# Patient Record
Sex: Male | Born: 1935 | Race: Black or African American | Hispanic: No | Marital: Married | Smoking: Never smoker
Health system: Southern US, Community
[De-identification: ages and names within clinical notes are randomized; demographics above are authoritative.]

## PROBLEM LIST (undated history)

## (undated) DIAGNOSIS — N289 Disorder of kidney and ureter, unspecified: Secondary | ICD-10-CM

## (undated) DIAGNOSIS — M1990 Unspecified osteoarthritis, unspecified site: Secondary | ICD-10-CM

## (undated) DIAGNOSIS — E119 Type 2 diabetes mellitus without complications: Secondary | ICD-10-CM

## (undated) HISTORY — PX: LIVER CYST REMOVAL: SHX5951

## (undated) HISTORY — PX: HERNIA REPAIR: SHX51

## (undated) HISTORY — PX: ROTATOR CUFF REPAIR: SHX139

## (undated) HISTORY — DX: Unspecified osteoarthritis, unspecified site: M19.90

---

## 2005-08-23 ENCOUNTER — Ambulatory Visit: Payer: Self-pay | Admitting: Unknown Physician Specialty

## 2007-09-05 ENCOUNTER — Emergency Department: Payer: Self-pay | Admitting: Emergency Medicine

## 2010-02-19 ENCOUNTER — Ambulatory Visit: Payer: Self-pay | Admitting: Ophthalmology

## 2010-03-02 ENCOUNTER — Ambulatory Visit: Payer: Self-pay | Admitting: Ophthalmology

## 2010-03-10 ENCOUNTER — Ambulatory Visit: Payer: Self-pay | Admitting: Cardiology

## 2011-05-19 ENCOUNTER — Ambulatory Visit: Payer: Self-pay | Admitting: Ophthalmology

## 2011-06-07 ENCOUNTER — Ambulatory Visit: Payer: Self-pay | Admitting: Ophthalmology

## 2014-04-19 ENCOUNTER — Emergency Department: Payer: Self-pay | Admitting: Emergency Medicine

## 2014-08-25 NOTE — Op Note (Signed)
PATIENT NAME:  Ross Odonnell, Ross Odonnell DATE OF BIRTH:  Dec 08, 1930  DATE OF PROCEDURE:  06/07/2011  PREOPERATIVE DIAGNOSIS:  Cataract, left eye.    POSTOPERATIVE DIAGNOSIS:  Cataract, left eye.  PROCEDURE PERFORMED:  Extracapsular cataract extraction using phacoemulsification with placement of an Alcon [id number] 21.5-diopter posterior chamber lens, serial # [id number].  SURGEON:  Maylon PeppersSteven A. Camron Monday, MD  ASSISTANT:  None.  ANESTHESIA:  4% lidocaine and 0.75% Marcaine in a 50/50 mixture with 10 units/mL of Hylenex added, given as a peribulbar.  ANESTHESIOLOGIST:  Dr. Darleene CleaverVan Staveren.   COMPLICATIONS:  None.  ESTIMATED BLOOD LOSS:  Less than 1 mL.  DESCRIPTION OF PROCEDURE:  The patient was brought to the operating room and given a peribulbar block.  The patient was then prepped and draped in the usual fashion.  The vertical rectus muscles were imbricated using 5-0 silk sutures.  These sutures were then clamped to the sterile drapes as bridle sutures.  A limbal peritomy was performed extending two clock hours and hemostasis was obtained with cautery.  A partial thickness scleral groove was made at the surgical limbus and dissected anteriorly in a lamellar dissection using an Alcon crescent knife.  The anterior chamber was entered supero-temporally with a Superblade and through the lamellar dissection with a 2.6 mm keratome.  DisCoVisc was used to replace the aqueous and a continuous tear capsulorrhexis was carried out.  Hydrodissection and hydrodelineation were carried out with balanced salt and a 27 gauge canula.  The nucleus was rotated to confirm the effectiveness of the hydrodissection.  Phacoemulsification was carried out using a divide-and-conquer technique.  Total ultrasound time was 1 minute and 25 seconds with an average power of 13.9 percent.  Irrigation/aspiration was used to remove the residual cortex.  DisCoVisc was used to inflate the capsule and the internal incision was  enlarged to 3 mm with the crescent knife.  The intraocular lens was folded and inserted into the capsular bag using the AcrySert delivery system.  Irrigation/aspiration was used to remove the residual DisCoVisc.  Miostat was injected into the anterior chamber through the paracentesis track to inflate the anterior chamber and induce miosis.  The wound was checked for leaks and none were found. The conjunctiva was closed with cautery and the bridle sutures were removed.  Two drops of 0.3% Vigamox were placed on the eye.   An eye shield was placed on the eye.  The patient was discharged to the recovery room in good condition.  ____________________________ Maylon PeppersSteven A. Aanvi Voyles, MD sad:cms D: 06/07/2011 12:30:03 ET T: 06/07/2011 12:49:04 ET JOB#: [id number]  cc: Viviann SpareSteven A. Jahsir Rama, MD, <Dictator> Erline LevineSTEVEN A Faria Casella MD ELECTRONICALLY SIGNED 06/14/2011 13:02

## 2015-07-01 ENCOUNTER — Other Ambulatory Visit: Payer: Self-pay | Admitting: Ophthalmology

## 2015-07-01 DIAGNOSIS — H5462 Unqualified visual loss, left eye, normal vision right eye: Secondary | ICD-10-CM

## 2015-07-04 ENCOUNTER — Ambulatory Visit
Admission: RE | Admit: 2015-07-04 | Discharge: 2015-07-04 | Disposition: A | Discharge status: Home / Self Care | Payer: Medicare Other | Source: Ambulatory Visit | Attending: Ophthalmology | Admitting: Ophthalmology

## 2015-07-04 DIAGNOSIS — R9082 White matter disease, unspecified: Secondary | ICD-10-CM | POA: Diagnosis not present

## 2015-07-04 DIAGNOSIS — M898X8 Other specified disorders of bone, other site: Secondary | ICD-10-CM | POA: Insufficient documentation

## 2015-07-04 DIAGNOSIS — H5452 Low vision, left eye, normal vision right eye: Secondary | ICD-10-CM | POA: Diagnosis not present

## 2015-07-04 DIAGNOSIS — H5462 Unqualified visual loss, left eye, normal vision right eye: Secondary | ICD-10-CM

## 2015-07-04 DIAGNOSIS — J341 Cyst and mucocele of nose and nasal sinus: Secondary | ICD-10-CM | POA: Insufficient documentation

## 2015-07-04 DIAGNOSIS — S0232XA Fracture of orbital floor, left side, initial encounter for closed fracture: Secondary | ICD-10-CM | POA: Diagnosis not present

## 2015-07-04 LAB — POCT I-STAT CREATININE: CREATININE: 2 mg/dL — AB (ref 0.61–1.24)

## 2015-07-04 MED ORDER — GADOBENATE DIMEGLUMINE 529 MG/ML IV SOLN
10.0000 mL | Freq: Once | INTRAVENOUS | Status: AC | PRN
  Administered 2015-07-04: 9 mL via INTRAVENOUS

## 2016-11-25 ENCOUNTER — Emergency Department (HOSPITAL_COMMUNITY)
Admission: EM | Admit: 2016-11-25 | Discharge: 2016-11-25 | Disposition: A | Discharge status: Home / Self Care | Payer: Medicare Other | Attending: Emergency Medicine | Admitting: Emergency Medicine

## 2016-11-25 ENCOUNTER — Encounter (HOSPITAL_COMMUNITY): Payer: Self-pay | Admitting: Emergency Medicine

## 2016-11-25 ENCOUNTER — Emergency Department (HOSPITAL_COMMUNITY): Payer: Medicare Other

## 2016-11-25 DIAGNOSIS — Z23 Encounter for immunization: Secondary | ICD-10-CM | POA: Diagnosis not present

## 2016-11-25 DIAGNOSIS — W270XXA Contact with workbench tool, initial encounter: Secondary | ICD-10-CM | POA: Insufficient documentation

## 2016-11-25 DIAGNOSIS — Y999 Unspecified external cause status: Secondary | ICD-10-CM | POA: Diagnosis not present

## 2016-11-25 DIAGNOSIS — E119 Type 2 diabetes mellitus without complications: Secondary | ICD-10-CM | POA: Diagnosis not present

## 2016-11-25 DIAGNOSIS — S6992XA Unspecified injury of left wrist, hand and finger(s), initial encounter: Secondary | ICD-10-CM | POA: Diagnosis present

## 2016-11-25 DIAGNOSIS — Y929 Unspecified place or not applicable: Secondary | ICD-10-CM | POA: Insufficient documentation

## 2016-11-25 DIAGNOSIS — S62601A Fracture of unspecified phalanx of left index finger, initial encounter for closed fracture: Secondary | ICD-10-CM | POA: Diagnosis not present

## 2016-11-25 DIAGNOSIS — S62600A Fracture of unspecified phalanx of right index finger, initial encounter for closed fracture: Secondary | ICD-10-CM

## 2016-11-25 DIAGNOSIS — Y9389 Activity, other specified: Secondary | ICD-10-CM | POA: Diagnosis not present

## 2016-11-25 HISTORY — DX: Type 2 diabetes mellitus without complications: E11.9

## 2016-11-25 HISTORY — DX: Disorder of kidney and ureter, unspecified: N28.9

## 2016-11-25 MED ORDER — TETANUS-DIPHTH-ACELL PERTUSSIS 5-2.5-18.5 LF-MCG/0.5 IM SUSP
0.5000 mL | Freq: Once | INTRAMUSCULAR | Status: AC
  Administered 2016-11-25: 0.5 mL via INTRAMUSCULAR
  Filled 2016-11-25: qty 0.5

## 2016-11-25 MED ORDER — CEPHALEXIN 500 MG PO CAPS: 500.0000 mg | ORAL_CAPSULE | Freq: Four times a day (QID) | ORAL | 0 refills | Status: AC

## 2016-11-25 MED ORDER — TRAMADOL HCL 50 MG PO TABS: 50.0000 mg | ORAL_TABLET | Freq: Four times a day (QID) | ORAL | 0 refills | Status: AC | PRN

## 2016-11-25 NOTE — ED Triage Notes (Signed)
Pt cut left first, second and third digits on table saw. Bleeding controlled.

## 2016-11-25 NOTE — ED Provider Notes (Signed)
[facility]Y DEPT Provider Note   CSN: [id number] Arrival date & time: 11/25/16  1105     History   Chief Complaint Chief Complaint  Patient presents with  . Hand Pain    HPI Ross Odonnell is a 83 y.o. male.    Patient states that he and his left hand with a saw. This SORT of bounced back against his hand   The history is provided by the patient.  Hand Injury   The incident occurred 3 to 5 hours ago. The incident occurred at home. Injury mechanism: Saw. The pain is present in the left fingers. The quality of the pain is described as aching. The pain is at a severity of 4/10. The pain is moderate. The pain has been constant since the incident. Pertinent negatives include no fever. He reports no foreign bodies present.    Past Medical History:  Diagnosis Date  . Diabetes mellitus without complication (HCC)   . Renal disorder     There are no active problems to display for this patient.   Past Surgical History:  Procedure Laterality Date  . HERNIA REPAIR    . LIVER CYST REMOVAL    . ROTATOR CUFF REPAIR         Home Medications    Prior to Admission medications   Medication Sig Start Date End Date Taking? Authorizing Provider  cephALEXin (KEFLEX) 500 MG capsule Take 1 capsule (500 mg total) by mouth 4 (four) times daily. 11/25/16   Bethann BerkshireZammit, Ira Dougher, MD  traMADol (ULTRAM) 50 MG tablet Take 1 tablet (50 mg total) by mouth every 6 (six) hours as needed. 11/25/16   Bethann BerkshireZammit, Maranatha Grossi, MD    Family History No family history on file.  Social History Social History  Substance Use Topics  . Smoking status: Never Smoker  . Smokeless tobacco: Never Used  . Alcohol use No     Allergies   Claritin [loratadine]   Review of Systems Review of Systems  Constitutional: Negative for appetite change, fatigue and fever.  HENT: Negative for congestion, ear discharge and sinus pressure.   Eyes: Negative for discharge.  Respiratory: Negative for cough.   Cardiovascular:  Negative for chest pain.  Gastrointestinal: Negative for abdominal pain and diarrhea.  Genitourinary: Negative for frequency and hematuria.  Musculoskeletal: Negative for back pain.       Left hand injury  Skin: Negative for rash.  Neurological: Negative for seizures and headaches.  Psychiatric/Behavioral: Negative for hallucinations.     Physical Exam Updated Vital Signs BP (!) 159/47 (BP Location: Right Arm)   Pulse 68   Temp 98.6 F (37 C) (Oral)   Resp 18   Ht 5\' 7"  (1.702 m)   Wt 82.6 kg (182 lb)   SpO2 98%   BMI 28.51 kg/m   Physical Exam  Constitutional: He is oriented to person, place, and time. He appears well-developed.  HENT:  Head: Normocephalic.  Eyes: Conjunctivae and EOM are normal. No scleral icterus.  Neck: Neck supple. No thyromegaly present.  Cardiovascular: Normal rate and regular rhythm.  Exam reveals no gallop and no friction rub.   No murmur heard. Pulmonary/Chest: No stridor. He has no wheezes. He has no rales. He exhibits no tenderness.  Abdominal: He exhibits no distension. There is no tenderness. There is no rebound.  Musculoskeletal: Normal range of motion. He exhibits no edema.  Patient has superficial lacerations on his distal left index finger and distal left middle finger. The lacerations are not suturable  Lymphadenopathy:    He has no cervical adenopathy.  Neurological: He is oriented to person, place, and time. He exhibits normal muscle tone. Coordination normal.  Skin: No rash noted. No erythema.  Psychiatric: He has a normal mood and affect. His behavior is normal.     ED Treatments / Results  Labs (all labs ordered are listed, but only abnormal results are displayed) Labs Reviewed - No data to display  EKG  EKG Interpretation None       Radiology Dg Hand Complete Left  Result Date: 11/25/2016 CLINICAL DATA:  Laceration from table saw to index and middle fingers EXAM: LEFT HAND - COMPLETE 3+ VIEW COMPARISON:  None.  FINDINGS: Radiopaque foreign bodies project over the soft tissues at the tip of the left middle finger. There is a fracture through the distal phalanx of the left index finger, nondisplaced. This appears to enter the DIP joint. No fracture in the middle finger. No subluxation or dislocation. IMPRESSION: Nondisplaced fracture through the distal phalanx of the left index finger. Radiopaque densities at the tip of the left middle finger. Electronically Signed   By: Charlett NoseKevin  Dover M.D.   On: 11/25/2016 11:35    Procedures Procedures (including critical care time)  Medications Ordered in ED Medications  Tdap (BOOSTRIX) injection 0.5 mL (not administered)     Initial Impression / Assessment and Plan / ED Course  I have reviewed the triage vital signs and the nursing notes.  Pertinent labs & imaging results that were available during my care of the patient were reviewed by me and considered in my medical decision making (see chart for details).     Small lacerations and nondisplaced fracture of left index finger. Patient will be placed on pain medicine antibiotics have finger splinted and follow-up with Dr. Romeo AppleHarrison orthopedics  Final Clinical Impressions(s) / ED Diagnoses   Final diagnoses:  Closed nondisplaced fracture of phalanx of right index finger, unspecified phalanx, initial encounter    New Prescriptions New Prescriptions   CEPHALEXIN (KEFLEX) 500 MG CAPSULE    Take 1 capsule (500 mg total) by mouth 4 (four) times daily.   TRAMADOL (ULTRAM) 50 MG TABLET    Take 1 tablet (50 mg total) by mouth every 6 (six) hours as needed.     Bethann BerkshireZammit, Abbie Jablon, MD 11/25/16 1344

## 2016-11-25 NOTE — Discharge Instructions (Signed)
Clean laceration twice a day with soap and water.  Follow up with Dr. Romeo AppleHarrison next week

## 2016-11-30 ENCOUNTER — Ambulatory Visit (INDEPENDENT_AMBULATORY_CARE_PROVIDER_SITE_OTHER): Payer: Medicare Other | Admitting: Orthopaedic Surgery

## 2016-11-30 ENCOUNTER — Encounter: Payer: Self-pay | Admitting: Orthopaedic Surgery

## 2016-11-30 VITALS — BP 133/57 | HR 53 | Temp 97.7°F | Ht 67.0 in | Wt 191.0 lb

## 2016-11-30 DIAGNOSIS — S62661A Nondisplaced fracture of distal phalanx of left index finger, initial encounter for closed fracture: Secondary | ICD-10-CM | POA: Diagnosis not present

## 2016-11-30 NOTE — Progress Notes (Signed)
Subjective:    Patient ID: Ross Odonnell, male    DOB: 12/22/1930, 83 y.o.   MRN: [id number]  HPI He cut his left index and long finger tips on table saw on 11-25-16.  Wounds were superficial but the index finger had fracture of the distal phalanx.  He was seen in the ER.  I have reviewed the ER records, x-rays and x-ray report.  He was given splint.  He is better. His wounds are healing well.  He has no other problem.   Review of Systems  HENT: Negative for congestion.   Respiratory: Negative for cough and shortness of breath.   Cardiovascular: Negative for chest pain and leg swelling.  Endocrine: Negative for cold intolerance.  Musculoskeletal: Positive for arthralgias.  Allergic/Immunologic: Negative for environmental allergies.   Past Medical History:  Diagnosis Date  . Arthritis   . Diabetes mellitus without complication (HCC)   . Renal disorder     Past Surgical History:  Procedure Laterality Date  . HERNIA REPAIR    . LIVER CYST REMOVAL    . ROTATOR CUFF REPAIR      Current Outpatient Prescriptions on File Prior to Visit  Medication Sig Dispense Refill  . cephALEXin (KEFLEX) 500 MG capsule Take 1 capsule (500 mg total) by mouth 4 (four) times daily. 28 capsule 0  . traMADol (ULTRAM) 50 MG tablet Take 1 tablet (50 mg total) by mouth every 6 (six) hours as needed. 15 tablet 0   No current facility-administered medications on file prior to visit.     Social History   Social History  . Marital status: Married    Spouse name: N/A  . Number of children: N/A  . Years of education: N/A   Occupational History  . Not on file.   Social History Main Topics  . Smoking status: Never Smoker  . Smokeless tobacco: Never Used  . Alcohol use No  . Drug use: No  . Sexual activity: Not on file   Other Topics Concern  . Not on file   Social History Narrative  . No narrative on file    History reviewed. No pertinent family history.  BP (!) 133/57   Pulse (!) 53    Temp 97.7 F (36.5 C)   Ht 5\' 7"  (1.702 m)   Wt 191 lb (86.6 kg)   BMI 29.91 kg/m      Objective:   Physical Exam  Constitutional: He is oriented to person, place, and time. He appears well-developed and well-nourished.  HENT:  Head: Normocephalic and atraumatic.  Eyes: Pupils are equal, round, and reactive to light. Conjunctivae and EOM are normal.  Neck: Normal range of motion. Neck supple.  Cardiovascular: Normal rate, regular rhythm and intact distal pulses.   Pulmonary/Chest: Effort normal.  Abdominal: Soft.  Musculoskeletal: He exhibits tenderness (He has superfical healing wounds of the tips of the left index and long fingers, ROM is full, NV intact.  Rest of hand negative.).  Neurological: He is alert and oriented to person, place, and time. He has normal reflexes. He displays normal reflexes. No cranial nerve deficit. He exhibits normal muscle tone. Coordination normal.  Skin: Skin is warm and dry.  Psychiatric: He has a normal mood and affect. His behavior is normal. Judgment and thought content normal.  Vitals reviewed.         Assessment & Plan:   Encounter Diagnosis  Name Primary?  . Closed nondisplaced fracture of distal phalanx of left index finger,  initial encounter Yes   A new finger splint given.  Return in three weeks.  Call if any problem.  Precautions discussed.   Electronically Signed Darreld McleanWayne Mahamed Zalewski, MD 7/31/20182:52 PM

## 2016-12-14 ENCOUNTER — Telehealth: Payer: Self-pay | Admitting: Orthopaedic Surgery

## 2016-12-14 NOTE — Telephone Encounter (Signed)
Patient called to relay that his left index finger is much better, and "not even needing a band-aid" - asking if he may cancel his upcoming follow up appointment f94 12/21/16. Office visit note reads:  "Return in three weeks.   Call if any problem.   Precautions discussed."

## 2016-12-21 ENCOUNTER — Ambulatory Visit (INDEPENDENT_AMBULATORY_CARE_PROVIDER_SITE_OTHER): Payer: Medicare Other

## 2016-12-21 ENCOUNTER — Ambulatory Visit (INDEPENDENT_AMBULATORY_CARE_PROVIDER_SITE_OTHER): Payer: Medicare Other | Admitting: Orthopaedic Surgery

## 2016-12-21 VITALS — BP 120/54 | HR 50 | Temp 97.1°F | Ht 67.0 in | Wt 191.0 lb

## 2016-12-21 DIAGNOSIS — S62661D Nondisplaced fracture of distal phalanx of left index finger, subsequent encounter for fracture with routine healing: Secondary | ICD-10-CM

## 2016-12-21 NOTE — Progress Notes (Signed)
Patient Ross Odonnell, male DOB:[birth year], 83 y.o. BBC:[id number]  Chief Complaint  Patient presents with  . Follow-up    left index finger    HPI  Ross Odonnell is a 83 y.o. male who has a fracture of the left index finger distally.  He is doing well with no problem. HPI  Body mass index is 29.91 kg/m.  ROS  Review of Systems  HENT: Negative for congestion.   Respiratory: Negative for cough and shortness of breath.   Cardiovascular: Negative for chest pain and leg swelling.  Endocrine: Negative for cold intolerance.  Musculoskeletal: Positive for arthralgias.  Allergic/Immunologic: Negative for environmental allergies.    Past Medical History:  Diagnosis Date  . Arthritis   . Diabetes mellitus without complication (HCC)   . Renal disorder     Past Surgical History:  Procedure Laterality Date  . HERNIA REPAIR    . LIVER CYST REMOVAL    . ROTATOR CUFF REPAIR      No family history on file.  Social History Social History  Substance Use Topics  . Smoking status: Never Smoker  . Smokeless tobacco: Never Used  . Alcohol use No    Allergies  Allergen Reactions  . Codeine     Other reaction(s): Other (See Comments) Other Reaction: Urinary retention  . Claritin [Loratadine]     Current Outpatient Prescriptions  Medication Sig Dispense Refill  . cephALEXin (KEFLEX) 500 MG capsule Take 1 capsule (500 mg total) by mouth 4 (four) times daily. 28 capsule 0  . traMADol (ULTRAM) 50 MG tablet Take 1 tablet (50 mg total) by mouth every 6 (six) hours as needed. 15 tablet 0   No current facility-administered medications for this visit.      Physical Exam  Blood pressure (!) 120/54, pulse (!) 50, temperature (!) 97.1 F (36.2 C), height 5\' 7"  (1.702 m), weight 191 lb (86.6 kg).  Constitutional: overall normal hygiene, normal nutrition, well developed, normal grooming, normal body habitus. Assistive device:none  Musculoskeletal: gait and station Limp  none, muscle tone and strength are normal, no tremors or atrophy is present.  .  Neurological: coordination overall normal.  Deep tendon reflex/nerve stretch intact.  Sensation normal.  Cranial nerves II-XII intact.   Skin:   Normal overall no scars, lesions, ulcers or rashes. No psoriasis.  Psychiatric: Alert and oriented x 3.  Recent memory intact, remote memory unclear.  Normal mood and affect. Well groomed.  Good eye contact.  Cardiovascular: overall no swelling, no varicosities, no edema bilaterally, normal temperatures of the legs and arms, no clubbing, cyanosis and good capillary refill.  Lymphatic: palpation is normal.  Left index finger has some distal tenderness.  NV intact. ROM is full.  The patient has been educated about the nature of the problem(s) and counseled on treatment options.  The patient appeared to understand what I have discussed and is in agreement with it.  Encounter Diagnosis  Name Primary?  . Closed nondisplaced fracture of distal phalanx of left index finger with routine healing, subsequent encounter Yes    PLAN Call if any problems.  Precautions discussed.  Continue current medications.   Return to clinic 3 weeks   X-rays on return index finger left.  Electronically Signed Darreld Mclean, MD 8/21/20182:06 PM

## 2017-01-11 ENCOUNTER — Encounter: Payer: Medicare Other | Admitting: Orthopaedic Surgery

## 2017-01-11 ENCOUNTER — Encounter: Payer: Self-pay | Admitting: Orthopaedic Surgery

## 2017-01-12 ENCOUNTER — Other Ambulatory Visit: Payer: Medicare Other

## 2017-01-12 NOTE — Progress Notes (Signed)
This encounter was created in error - please disregard.

## 2017-08-30 IMAGING — DX DG HAND COMPLETE 3+V*L*
3 series · 3 of 3 positions shown · non-contrast
Comparison: None.

CLINICAL DATA: Laceration from table saw to index and middle
fingers

EXAM:
LEFT HAND - COMPLETE 3+ VIEW

[hand pa]
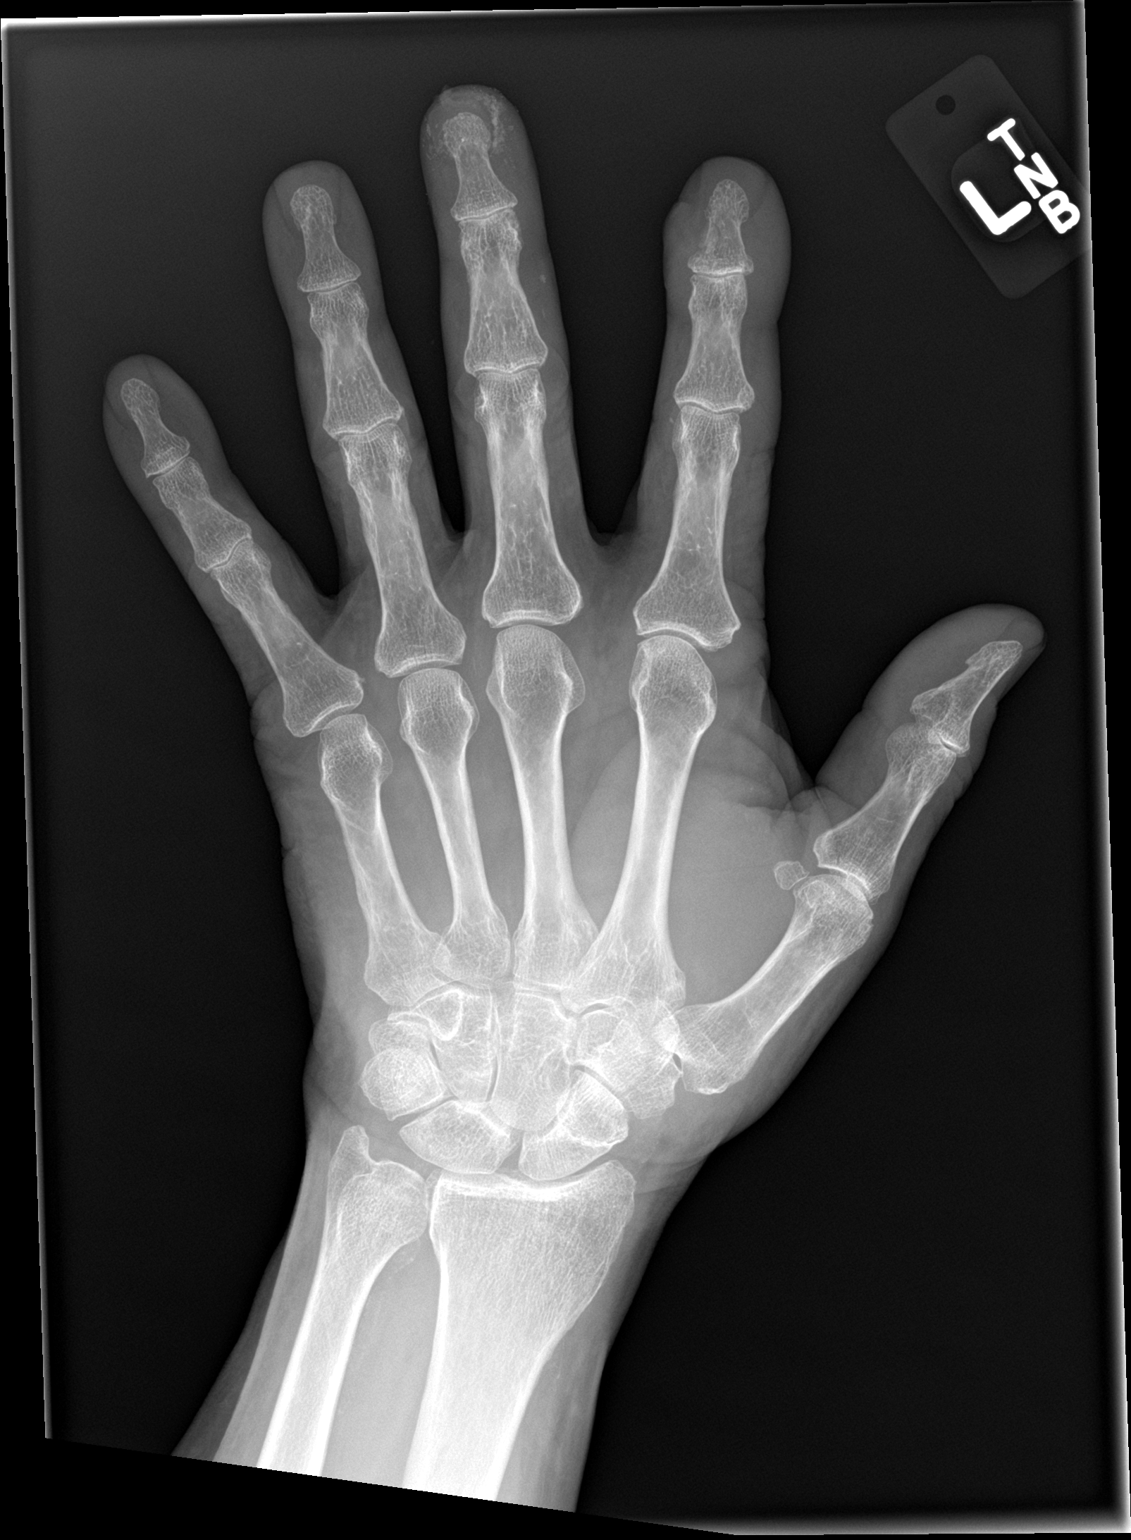

[hand obl]
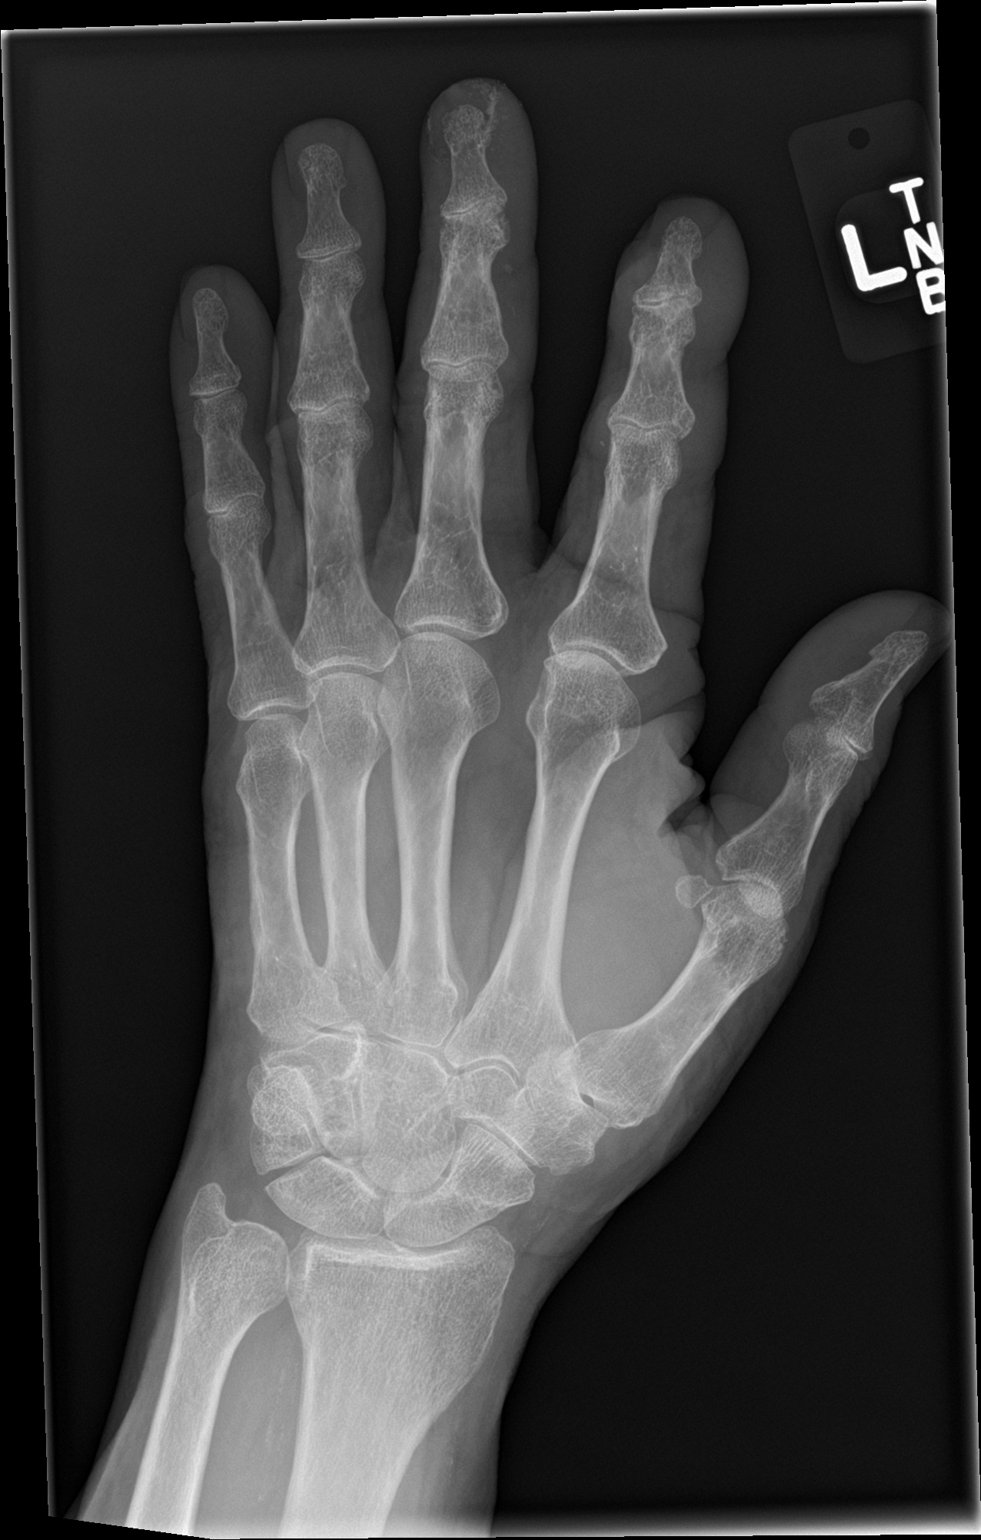

[hand lat]
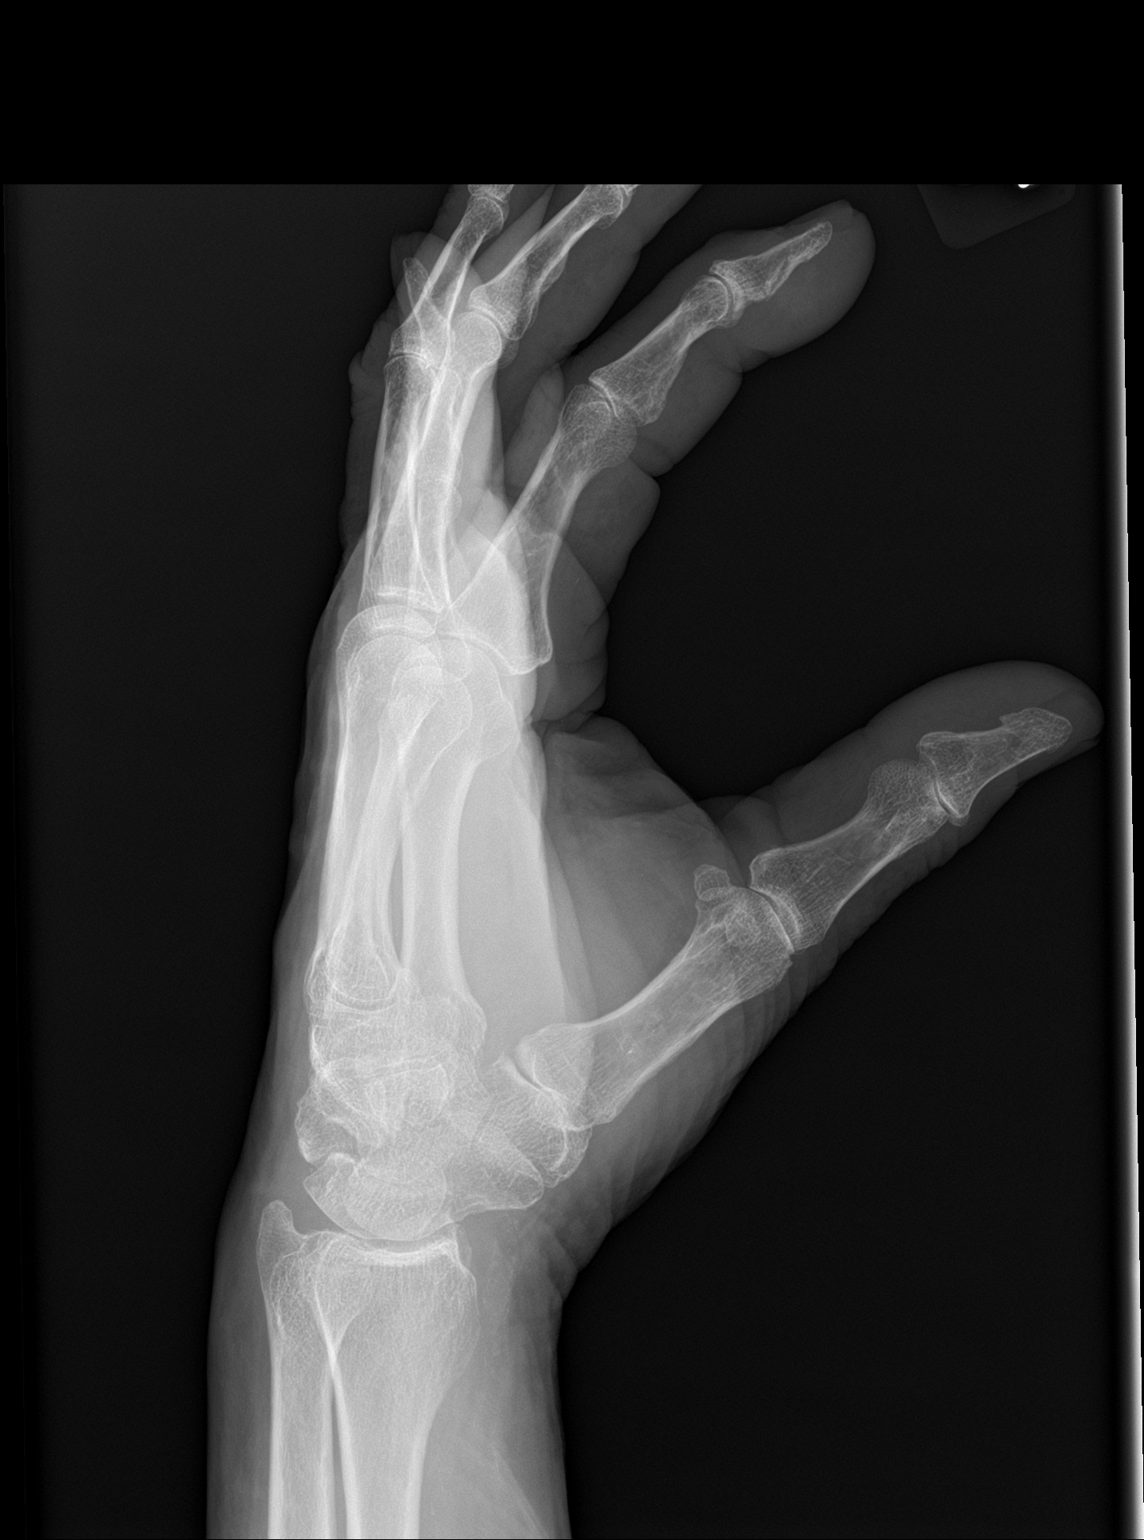

[3 of 3 positions shown; findings below may reference images not displayed]

FINDINGS: Radiopaque foreign bodies project over the soft tissues at the tip
of the left middle finger. There is a fracture through the distal
phalanx of the left index finger, nondisplaced. This appears to
enter the DIP joint. No fracture in the middle finger. No
subluxation or dislocation.
IMPRESSION: Nondisplaced fracture through the distal phalanx of the left index
finger.

Radiopaque densities at the tip of the left middle finger.

## 2017-10-17 ENCOUNTER — Ambulatory Visit: Payer: Medicare Other | Attending: Family Medicine | Admitting: Physical Therapy

## 2017-10-17 ENCOUNTER — Encounter: Payer: Self-pay | Admitting: Physical Therapy

## 2017-10-17 ENCOUNTER — Other Ambulatory Visit: Payer: Self-pay

## 2017-10-17 DIAGNOSIS — R269 Unspecified abnormalities of gait and mobility: Secondary | ICD-10-CM | POA: Insufficient documentation

## 2017-10-17 DIAGNOSIS — M6281 Muscle weakness (generalized): Secondary | ICD-10-CM | POA: Insufficient documentation

## 2017-10-17 NOTE — Therapy (Signed)
[facility] [address] [place], [place], [address] Phone: [phone]   Fax:  [phone]  Physical Therapy Evaluation  Patient Details  Name: Ross Odonnell: [id number] Date of Birth: 02/08/1931 Referring Provider: Dr. Illene RegulusSelvidge   Encounter Date: 10/17/2017  PT End of Session - 10/17/17 1437    Visit Number  1    Number of Visits  4    Date for PT Re-Evaluation  11/14/17    PT Start Time  1341    PT Stop Time  1437    PT Time Calculation (min)  56 min    Activity Tolerance  Patient tolerated treatment well    Behavior During Therapy  [facility] for tasks assessed/performed       Past Medical History:  Diagnosis Date  . Arthritis   . Diabetes mellitus without complication (HCC)   . Renal disorder     Past Surgical History:  Procedure Laterality Date  . HERNIA REPAIR    . LIVER CYST REMOVAL    . ROTATOR CUFF REPAIR      There were no vitals filed for this visit.   Subjective Assessment - 10/17/17 1337    Subjective  Pt. reports no knee pain but moderate B LE (R>L) muscle weakness.  Pt. reports no falls.      Pertinent History  Pt. lives at home with wife.  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  Pt. enjoys gardening.      Limitations  Standing;Walking;House hold activities    Patient Stated Goals  Increase B LE muscle strength.      Currently in Pain?  No/denies         [facility] - 10/17/17 0001      Assessment   Medical Diagnosis  B LE muscle weakness    Referring Provider  Dr. Illene RegulusSelvidge    Onset Date/Surgical Date  10/01/16 sometime in August    Next MD Visit  August (unknown date)    Prior Therapy  yes      Balance Screen   Has the patient fallen in the past 6 months  No      Prior Function   Level of Independence  Independent         See HEP.    PT Education - 10/17/17 1436    Education Details  See HEP    Person(s) Educated  Patient    Methods   Explanation;Demonstration;Handout    Comprehension  Verbalized understanding;Returned demonstration          PT Long Term Goals - 10/17/17 1454      PT LONG TERM GOAL #1   Title  Pt. will be independent with HEP to increase B hip flexion 1 full muscle grade to 4+/5 MMT to improve daily mobility with household/ gardening tasks.      Baseline  Manual muscle testing: B quads 5/5 MMT, B hamstrings 5/5 MMT, B hip adduction/ abduction 5/5 MMT, R hip flexion 3+/5 and L 4-/5 MMT.    Time  4    Period  Weeks    Status  New    Target Date  11/14/17      PT LONG TERM GOAL #2   Title  Pt. will increase FOTO to 62 to improve daily functional mobility.      Baseline  6/17 FOTO baseline 54.      Time  4    Period  Weeks  Status  New    Target Date  11/14/17      PT LONG TERM GOAL #3   Title  Pt. able to complete all aspects of gardening/ yardwork with no c/o B LE muscle weakness/ fatigue.      Baseline  pt. reports increase B LE weakness and fatigue with yardwork.  Primary complaint during PT evaluation.      Time  4    Period  Weeks    Status  New    Target Date  11/14/17          Plan - 10/17/17 1335    Clinical Impression Statement  Pt. is a pleasant 84 y/o male with chronic (>1 year) c/o LE muscle weakness.  Pt. reports no pain in LE/knees at time of evaluation.  Manual muscle testing:  B quads 5/5 MMT, B hamstrings 5/5 MMT, B hip adduction/ abduction 5/5 MMT, R hip flexion 3+/5 and L 4-/5 MMT.  Good B LE muscle sensation.  Slight L hip drop noted during gait pattern.  Pt. ambulates with consistent step pattern/ heel strike with no assistive device.  Supine SLR (-).  R distal hamstring (-27 deg.), proximal 75 deg.  L hamstring distal (-34 deg.), proximal.  FOTO: baseline 54/ goal 62.  Tandem/ SLS (> 20 sec.)- no issues and no falls reported.  Pt. will benefit from short-term skilled PT services to develop home exercise/ strengthening program to improve LE muscle strength and  independence with daily tasks.       Clinical Presentation  Stable    Clinical Presentation due to:  (+) motivation/ active.  (-) age/ chronicity.      Clinical Decision Making  Low    Rehab Potential  Good    PT Frequency  1x / week    PT Duration  4 weeks    PT Treatment/Interventions  ADLs/Self Care Home Management;Cryotherapy;Moist Heat;Gait training;Stair training;Functional mobility training;Therapeutic activities;Therapeutic exercise;Balance training;Patient/family education;Neuromuscular re-education;Manual techniques;Passive range of motion    PT Next Visit Plan  Progress HEP.      PT Home Exercise Plan  See handouts    Consulted and Agree with Plan of Care  Patient       Patient will benefit from skilled therapeutic intervention in order to improve the following deficits and impairments:  Postural dysfunction, Decreased mobility, Decreased activity tolerance, Decreased range of motion, Decreased strength, Hypomobility, Impaired flexibility, Decreased balance, Difficulty walking  Visit Diagnosis: Muscle weakness (generalized)  Gait difficulty     Problem List There are no active problems to display for this patient.  Cammie Mcgee, PT, DPT # [phone] 10/17/2017, 4:36 PM  [facility] [address] [place], [place], [address] Phone: [phone]   Fax:  [phone]  Name: Ross Odonnell: [id number] Date of Birth: 08/30/1930

## 2017-10-17 NOTE — Patient Instructions (Signed)
Access Code: [email or web address]  URL: [email or web address]  Date: 10/17/2017  Prepared by: Dorene GrebeMichael Sherk   Exercises  Sit to Stand without Arm Support - 10 reps - 3 sets - 1x daily - 7x weekly  Dead Bug - 10 reps - 3 sets - 1x daily - 7x weekly  Supine Bridge - 10 reps - 3 sets - 1x daily - 7x weekly  Mini Lunge - 10 reps - 3 sets - 1x daily - 7x weekly

## 2017-10-31 ENCOUNTER — Ambulatory Visit: Payer: Medicare Other | Attending: Family Medicine | Admitting: Physical Therapy

## 2017-10-31 ENCOUNTER — Encounter: Payer: Self-pay | Admitting: Physical Therapy

## 2017-10-31 VITALS — HR 61

## 2017-10-31 DIAGNOSIS — R269 Unspecified abnormalities of gait and mobility: Secondary | ICD-10-CM | POA: Insufficient documentation

## 2017-10-31 DIAGNOSIS — M6281 Muscle weakness (generalized): Secondary | ICD-10-CM | POA: Insufficient documentation

## 2017-10-31 NOTE — Therapy (Signed)
[place] [facility] [address] [place], [place], [address] Phone: [phone]   Fax:  [phone]  Physical Therapy Treatment  Patient Details  Name: Ross Odonnell MRN: [id number] Date of Birth: [birth year] Referring Provider: Dr. Illene RegulusSelvidge   Encounter Date: 10/31/2017  PT End of Session - 10/31/17 0917    Visit Number  2    Number of Visits  4    Date for PT Re-Evaluation  11/14/17    PT Start Time  0811    PT Stop Time  0902    PT Time Calculation (min)  51 min    Activity Tolerance  Patient tolerated treatment well    Behavior During Therapy  [facility] for tasks assessed/performed       Past Medical History:  Diagnosis Date  . Arthritis   . Diabetes mellitus without complication (HCC)   . Renal disorder     Past Surgical History:  Procedure Laterality Date  . HERNIA REPAIR    . LIVER CYST REMOVAL    . ROTATOR CUFF REPAIR      Vitals:   10/31/17 0820  Pulse: 61    Subjective Assessment - 10/31/17 0915    Subjective  Pt reports not having time to do home exercises this week. No pain. Has been busy outdoors managing garden, property.    Pertinent History  Pt. lives at home with wife.  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  Pt. enjoys gardening.      Limitations  Standing;Walking;House hold activities    Patient Stated Goals  Increase B LE muscle strength.      Currently in Pain?  No/denies    Multiple Pain Sites  No          TREATMENT  Warm-up: Nu-step lv 7 x 5 min, lv 4 x 5 min (unbilled)   Supine: BLE HS stretches 3 x 60 sec BLE SLR 2 x 12 - pt demonstrates limited range secondary to HS tightness; cues to slow exercise for better eccentric control and core stabilization at pelvis; core stabilization poor with fatigue  Side-lying: Assessment: tightness in BLE hip flexors, psoas > quad BLE SLR in abd 2 x 8 - pt demonstrates flexion compensation increased with fatigue due to limited range and weakness in hip  abductors; struggles to correct with cues  Supine: Bridges 3 x 10 with cues to "push" hips and keep low back straight for increased hip extension        PT Education - 10/31/17 0916    Education Details  HEP - Hip add/abd therex, thomas stretch    Person(s) Educated  Patient    Methods  Explanation;Demonstration;Handout    Comprehension  Verbalized understanding;Verbal cues required;Returned demonstration          PT Long Term Goals - 10/17/17 1454      PT LONG TERM GOAL #1   Title  Pt. will be independent with HEP to increase B hip flexion 1 full muscle grade to 4+/5 MMT to improve daily mobility with household/ gardening tasks.      Baseline  Manual muscle testing: B quads 5/5 MMT, B hamstrings 5/5 MMT, B hip adduction/ abduction 5/5 MMT, R hip flexion 3+/5 and L 4-/5 MMT.    Time  4    Period  Weeks    Status  New    Target Date  11/14/17      PT LONG TERM GOAL #2   Title  Pt.  will increase FOTO to 62 to improve daily functional mobility.      Baseline  6/17 FOTO baseline 54.      Time  4    Period  Weeks    Status  New    Target Date  11/14/17      PT LONG TERM GOAL #3   Title  Pt. able to complete all aspects of gardening/ yardwork with no c/o B LE muscle weakness/ fatigue.      Baseline  pt. reports increase B LE weakness and fatigue with yardwork.  Primary complaint during PT evaluation.      Time  4    Period  Weeks    Status  New    Target Date  11/14/17         Plan - 10/31/17 0917    Clinical Impression Statement  Pt demonstrates mild forward flexed posture secondary to tightness in B hip flexors; limited but able to reach 0 deg. hip ext. Pt able to tolerate thomas stretch, HS stretch. Difficulty with low squat secondary to weakness and limited ROM but able to perform mini squats and STS from low chair. Demonstrates external rotation tendency L>R in sidestepping; pt able to correct with cues. Pt will benefit from short-term skilled PT services to develop  HEP and improve LE strength, squat mechanics, and independence with daily tasks.    Rehab Potential  Good    PT Frequency  1x / week    PT Duration  4 weeks    PT Treatment/Interventions  ADLs/Self Care Home Management;Cryotherapy;Moist Heat;Gait training;Stair training;Functional mobility training;Therapeutic activities;Therapeutic exercise;Balance training;Patient/family education;Neuromuscular re-education;Manual techniques;Passive range of motion    PT Next Visit Plan  Progress HEP. Hip abd/add to improve stabilization during low squat    PT Home Exercise Plan  See handouts    Consulted and Agree with Plan of Care  Patient       Patient will benefit from skilled therapeutic intervention in order to improve the following deficits and impairments:  Postural dysfunction, Decreased mobility, Decreased activity tolerance, Decreased range of motion, Decreased strength, Hypomobility, Impaired flexibility, Decreased balance, Difficulty walking  Visit Diagnosis: Muscle weakness (generalized)  Gait difficulty     Problem List There are no active problems to display for this patient.  Cammie Mcgee, PT, DPT # [id number] Janee Morn, SPT 10/31/2017, 1:25 PM  [place] [facility] [address] [place], [place], [address] Phone: [phone]   Fax:  [phone]  Name: Ross Odonnell MRN: [id number] Date of Birth: 1931/02/28

## 2017-11-14 ENCOUNTER — Ambulatory Visit: Payer: Medicare Other | Admitting: Physical Therapy

## 2017-11-17 ENCOUNTER — Ambulatory Visit: Payer: Medicare Other | Admitting: Physical Therapy

## 2017-11-17 ENCOUNTER — Encounter: Payer: Self-pay | Admitting: Physical Therapy

## 2017-11-17 DIAGNOSIS — M6281 Muscle weakness (generalized): Secondary | ICD-10-CM

## 2017-11-17 DIAGNOSIS — R269 Unspecified abnormalities of gait and mobility: Secondary | ICD-10-CM

## 2017-11-17 NOTE — Therapy (Signed)
[place] [facility] [address] [place], [place], [address] Phone: [phone]   Fax:  [phone]  Physical Therapy Treatment  Patient Details  Name: Ross Odonnell MRN: [id number] Date of Birth: 20-Feb-1931 Referring Provider: Dr. Illene Regulus   Encounter Date: 11/17/2017  PT End of Session - 11/17/17 0939    Visit Number  3    Number of Visits  7    Date for PT Re-Evaluation  12/15/17    PT Start Time  0809    PT Stop Time  0912    PT Time Calculation (min)  63 min    Activity Tolerance  Patient tolerated treatment well    Behavior During Therapy  [facility] for tasks assessed/performed       Past Medical History:  Diagnosis Date  . Arthritis   . Diabetes mellitus without complication (HCC)   . Renal disorder     Past Surgical History:  Procedure Laterality Date  . HERNIA REPAIR    . LIVER CYST REMOVAL    . ROTATOR CUFF REPAIR      There were no vitals filed for this visit.  Subjective Assessment - 11/17/17 0818    Subjective  Pt. reports no new symptoms this morning and reports no pain in knee.  Pt. has been performing some of the stretches from HEP, and staying active in the garden.    Pertinent History  Pt. lives at home with wife.  Pt. is retired and used to operate a Runner, broadcasting/film/video.  Pt. enjoys gardening.      Limitations  Standing;Walking;House hold activities    Patient Stated Goals  Increase B LE muscle strength.      Currently in Pain?  No/denies    Multiple Pain Sites  No       Treatment:   Warm-up: Scifit Level 5.5 forward/ 5 min backward  There Ex: Supine B LE SLR 2x10 Supine Bridges 2x15 Seated B LE Marches with 4# ankle weight 2x30 Seated B LE LAQ with 4# ankle weight 2x15 Supine Bicycle Kicks 2x10 Ascend/ Descend Stair with no UE support x8  Manual: Supine: B LE HS stretches 3 x 60 sec (distal and proximal) each B LE Thomas Stretch with Rectus Stretch 3x30sec B LE Piriformis 2x30 sec B LE Figure-4  2x30 sec   See new HEP     PT Education - 11/17/17 1050    Education Details  See new HEP/ pt. planning using pulley assist with hamstring stretches    Person(s) Educated  Patient    Methods  Explanation;Demonstration;Handout    Comprehension  Verbalized understanding;Returned demonstration          PT Long Term Goals - 11/17/17 0943      PT LONG TERM GOAL #1   Title  Pt. will be independent with HEP to increase B hip flexion 1 full muscle grade to 4+/5 MMT to improve daily mobility with household/ gardening tasks.    (Pended)     Baseline  Manual muscle testing: B quads 5/5 MMT, B hamstrings 5/5 MMT, B hip adduction/ abduction 5/5 MMT, R hip flexion 3+/5 and L 4-/5 MMT.  (Pended)     Time  4  (Pended)     Period  Weeks  (Pended)     Status  On-going  (Pended)     Target Date  11/14/17  (Pended)       PT LONG TERM GOAL #2   Title  Pt. will increase FOTO to  62 to improve daily functional mobility.    (Pended)     Baseline  6/17 FOTO baseline 54.    (Pended)     Time  4  (Pended)     Period  Weeks  (Pended)     Status  On-going  (Pended)     Target Date  11/14/17  (Pended)       PT LONG TERM GOAL #3   Title  Pt. able to complete all aspects of gardening/ yardwork with no c/o B LE muscle weakness/ fatigue.    (Pended)     Baseline  Pt. able to work in garden and on tractor without complaint of fatigue.  (Pended)     Time  4  (Pended)     Period  Weeks  (Pended)     Status  Achieved  (Pended)     Target Date  11/14/17  (Pended)       PT LONG TERM GOAL #4   Title  Pt. will perform star excursion test with no LOB.  (Pended)     Baseline  Pt. currently displays multiple (4+) steps due to LOB during star excursion test.  (Pended)     Time  4  (Pended)     Period  Weeks  (Pended)     Status  New  (Pended)     Target Date  12/15/17  (Pended)             Plan - 11/17/17 [id number]    Clinical Impression Statement  Pt. performing well in threapy with exercises and is  demonstrating increased strength in hip musculature without pain in L LE.  Pt. still exhibitis decreased ROM with B LE hip extension, which will be address in future viits.  Pt. demonstrates decreased balance during star excursion during session.  Good recip. step pattern with ascending/descending 4 steps in gym with no LOB.  Pt. will continue to benefit from skilled therapy for balance deficits and decreased ROM in LE.    Clinical Presentation  Stable    Clinical Decision Making  Low    Rehab Potential  Good    PT Frequency  Biweekly    PT Duration  4 weeks    PT Treatment/Interventions  ADLs/Self Care Home Management;Cryotherapy;Moist Heat;Gait training;Stair training;Functional mobility training;Therapeutic activities;Therapeutic exercise;Balance training;Patient/family education;Neuromuscular re-education;Manual techniques;Passive range of motion    PT Next Visit Plan  Assess balance and add in balance related program to HEP.    PT Home Exercise Plan  See handouts    Consulted and Agree with Plan of Care  Patient       Patient will benefit from skilled therapeutic intervention in order to improve the following deficits and impairments:  Postural dysfunction, Decreased mobility, Decreased activity tolerance, Decreased range of motion, Decreased strength, Hypomobility, Impaired flexibility, Decreased balance, Difficulty walking  Visit Diagnosis: Muscle weakness (generalized)  Gait difficulty     Problem List There are no active problems to display for this patient.  Cammie McgeeMichael C Sherk, PT, DPT # [id number] Tomasa HoseJosh Elton Heid, SPT 11/17/2017, 10:54 AM  [place] [facility] [address] [place], [place], [address] Phone: [phone]   Fax:  [phone]  Name: Ross Odonnell MRN: [id number] Date of Birth: [birth year]

## 2017-11-17 NOTE — Patient Instructions (Signed)
Access Code: [email or web address]  URL: [email or web address]  Date: 11/17/2017  Prepared by: Dorene GrebeMichael Kentavious Michele   Exercises  Standing Hip Extension - 20 reps - 1 sets - 2 hold - 1x daily - 7x weekly  Supine Hamstring Stretch with Strap - 3 reps - 1 sets - 20 hold - 1x daily - 7x weekly  Supine Bicycles - 20 reps - 1 sets - 1x daily - 7x weekly

## 2017-11-28 ENCOUNTER — Ambulatory Visit: Payer: Medicare Other | Admitting: Physical Therapy

## 2017-11-28 ENCOUNTER — Ambulatory Visit: Payer: Medicare Other

## 2017-11-28 DIAGNOSIS — M6281 Muscle weakness (generalized): Secondary | ICD-10-CM

## 2017-11-28 DIAGNOSIS — R269 Unspecified abnormalities of gait and mobility: Secondary | ICD-10-CM

## 2017-11-28 NOTE — Therapy (Signed)
[place] [facility] [address] [place], [place], [address] Phone: [phone]   Fax:  [phone]  Physical Therapy Treatment  Patient Details  Name: Ross Odonnell MRN: [id number] Date of Birth: 25-Apr-1931 Referring Provider: Dr. Illene Regulus   Encounter Date: 11/28/2017  PT End of Session - 11/28/17 1722    Visit Number  4    Number of Visits  7    Date for PT Re-Evaluation  12/15/17    PT Start Time  1303    PT Stop Time  1402    PT Time Calculation (min)  59 min    Activity Tolerance  Patient tolerated treatment well    Behavior During Therapy  [facility] for tasks assessed/performed       Past Medical History:  Diagnosis Date  . Arthritis   . Diabetes mellitus without complication (HCC)   . Renal disorder     Past Surgical History:  Procedure Laterality Date  . HERNIA REPAIR    . LIVER CYST REMOVAL    . ROTATOR CUFF REPAIR      There were no vitals filed for this visit.  Subjective Assessment - 11/28/17 1721    Subjective  Pt. reports no new complaints upon arrival to clinic.  Pt. reports to have been compliant with HEP since last visit.    Pertinent History  Pt. lives at home with wife.  Pt. is retired and used to operate a Runner, broadcasting/film/video.  Pt. enjoys gardening.      Limitations  Standing;Walking;House hold activities    Patient Stated Goals  Increase B LE muscle strength.      Currently in Pain?  No/denies          Treatment:  Warm-up: Scifit Level 4 x 15 min (unbilled);  Ther-ex  Total Gym (TG) squats x 16, x 18; TG heel raises 2 x 15; Standing hip marches, abduction, extension with 4# ankle weights 2 x 20 bilateral; Stair training ascending/descending 4 x 5 (20 total); Hip hikes on 6" step x 10 bilaterally; Black tband resisted walking forward/backwards/laterally x 10 each direction;                       PT Education - 11/29/17 1134    Education Details  exercise form/technique    Person(s)  Educated  Patient    Methods  Explanation    Comprehension  Verbalized understanding          PT Long Term Goals - 11/17/17 0943      PT LONG TERM GOAL #1   Title  Pt. will be independent with HEP to increase B hip flexion 1 full muscle grade to 4+/5 MMT to improve daily mobility with household/ gardening tasks.    (Pended)     Baseline  Manual muscle testing: B quads 5/5 MMT, B hamstrings 5/5 MMT, B hip adduction/ abduction 5/5 MMT, R hip flexion 3+/5 and L 4-/5 MMT.  (Pended)     Time  4  (Pended)     Period  Weeks  (Pended)     Status  On-going  (Pended)     Target Date  11/14/17  (Pended)       PT LONG TERM GOAL #2   Title  Pt. will increase FOTO to 62 to improve daily functional mobility.    (Pended)     Baseline  6/17 FOTO baseline 54.    (Pended)     Time  4  (  Pended)     Period  Weeks  (Pended)     Status  On-going  (Pended)     Target Date  11/14/17  (Pended)       PT LONG TERM GOAL #3   Title  Pt. able to complete all aspects of gardening/ yardwork with no c/o B LE muscle weakness/ fatigue.    (Pended)     Baseline  Pt. able to work in garden and on tractor without complaint of fatigue.  (Pended)     Time  4  (Pended)     Period  Weeks  (Pended)     Status  Achieved  (Pended)     Target Date  11/14/17  (Pended)       PT LONG TERM GOAL #4   Title  Pt. will perform star excursion test with no LOB.  (Pended)     Baseline  Pt. currently displays multiple (4+) steps due to LOB during star excursion test.  (Pended)     Time  4  (Pended)     Period  Weeks  (Pended)     Status  New  (Pended)     Target Date  12/15/17  (Pended)             Plan - 11/28/17 1723    Clinical Impression Statement  Pt. continues to improve in strength of B LE and performs well with increasing difficulty of exercises.  Pt. responded well to resisted walking and increased reps of stair climbing.  Pt. will continue to benefit from skilled therapy to increase strength in B LE and to  decrease risk for falls.    Clinical Presentation  Stable    Clinical Decision Making  Low    Rehab Potential  Good    PT Frequency  Biweekly    PT Duration  4 weeks    PT Treatment/Interventions  ADLs/Self Care Home Management;Cryotherapy;Moist Heat;Gait training;Stair training;Functional mobility training;Therapeutic activities;Therapeutic exercise;Balance training;Patient/family education;Neuromuscular re-education;Manual techniques;Passive range of motion    PT Next Visit Plan  Assess balance and add in balance related program to HEP.    PT Home Exercise Plan  See handouts    Consulted and Agree with Plan of Care  Patient       Patient will benefit from skilled therapeutic intervention in order to improve the following deficits and impairments:  Postural dysfunction, Decreased mobility, Decreased activity tolerance, Decreased range of motion, Decreased strength, Hypomobility, Impaired flexibility, Decreased balance, Difficulty walking  Visit Diagnosis: Muscle weakness (generalized)  Gait difficulty     Problem List There are no active problems to display for this patient.  This entire session was performed under direct supervision and direction of a licensed therapist/therapist assistant . I have personally read, edited and approve of the note as written.   Nolon BussingJoshua Robbins SPT Lynnea MaizesJason D Arionne Iams PT, DPT, GCS  Dereonna Lensing 11/29/2017, 12:03 PM  [place] [facility] [address] [place], [place], [address] Phone: [phone]   Fax:  [phone]  Name: Ross Odonnell MRN: [id number] Date of Birth: 09-16-1930

## 2017-12-12 ENCOUNTER — Ambulatory Visit: Payer: Medicare Other | Attending: Family Medicine | Admitting: Physical Therapy

## 2017-12-12 ENCOUNTER — Encounter: Payer: Self-pay | Admitting: Physical Therapy

## 2017-12-12 DIAGNOSIS — R269 Unspecified abnormalities of gait and mobility: Secondary | ICD-10-CM

## 2017-12-12 DIAGNOSIS — M6281 Muscle weakness (generalized): Secondary | ICD-10-CM | POA: Diagnosis present

## 2017-12-12 NOTE — Therapy (Signed)
[place] [facility] [address] [place], [place], [address] Phone: [phone]   Fax:  [phone]  Physical Therapy Treatment  Patient Details  Name: Ross Odonnell MRN: [id number] Date of Birth: 1930/05/24 Referring Provider: Dr. Shade Flood   Encounter Date: 12/12/2017  PT End of Session - 12/12/17 0804    Visit Number  5    Number of Visits  7    Date for PT Re-Evaluation  12/15/17    PT Start Time  0804    PT Stop Time  0856    PT Time Calculation (min)  52 min    Activity Tolerance  Patient tolerated treatment well    Behavior During Therapy  [facility] for tasks assessed/performed       Past Medical History:  Diagnosis Date  . Arthritis   . Diabetes mellitus without complication ([place])   . Renal disorder     Past Surgical History:  Procedure Laterality Date  . HERNIA REPAIR    . LIVER CYST REMOVAL    . ROTATOR CUFF REPAIR      There were no vitals filed for this visit.  Subjective Assessment - 12/12/17 0803    Subjective  Pt. states he is doing well.  No new complaints.  Pt. states he is working hard in his garden.      Pertinent History  Pt. lives at home with wife.  Pt. is retired and used to operate a Furniture conservator/restorer.  Pt. enjoys gardening.      Limitations  Standing;Walking;House hold activities    Patient Stated Goals  Increase B LE muscle strength.      Currently in Pain?  No/denies         Treatment:  Warm-up: Scifit Level 5 x 10+ min (unbilled);  Ther-ex  Total Gym (TG) squats x 20 Standing hip marches, abduction, extension with 4# ankle weights 2 x 20 bilateral; Stair training ascending/descending 4x with recip. Pattern. Hip hikes on 6" step x 10 bilaterally; Reviewed HEP in depth.   Supine LE stretches (7 min.).       PT Long Term Goals - 12/12/17 0847      PT LONG TERM GOAL #1   Title  Pt. will be independent with HEP to increase B hip flexion 1 full muscle grade to 4+/5 MMT to improve daily mobility  with household/ gardening tasks.      Baseline  Manual muscle testing: B quads 5/5 MMT, B hamstrings 5/5 MMT, B hip adduction/ abduction 5/5 MMT, R hip flexion 4+/5 and L 4+/5 MMT.    Time  4    Period  Weeks    Status  Achieved    Target Date  12/12/17      PT LONG TERM GOAL #2   Title  Pt. will increase FOTO to 62 to improve daily functional mobility.      Baseline  6/17 FOTO baseline 54.  8/12: 66 (goal met).      Time  4    Period  Weeks    Status  Achieved    Target Date  12/12/17      PT LONG TERM GOAL #3   Title  Pt. able to complete all aspects of gardening/ yardwork with no c/o B LE muscle weakness/ fatigue.      Baseline  no issues reported.      Time  4    Period  Weeks    Status  Achieved    Target Date  12/12/17         Plan - 12/12/17 1003    Clinical Impression Statement  Pt. has progressed well towards all PT goals.  Pt. understands importance of staying active and HEP.  Pt. instructed to contact PT if any c/o knee pain/ worsening symptoms.  Discharge from PT at this time.      Clinical Presentation  Stable    Clinical Decision Making  Low    Rehab Potential  Good    PT Frequency  Biweekly    PT Duration  4 weeks    PT Treatment/Interventions  ADLs/Self Care Home Management;Cryotherapy;Moist Heat;Gait training;Stair training;Functional mobility training;Therapeutic activities;Therapeutic exercise;Balance training;Patient/family education;Neuromuscular re-education;Manual techniques;Passive range of motion    PT Next Visit Plan  Discharge    PT Home Exercise Plan  See handouts    Consulted and Agree with Plan of Care  Patient       Patient will benefit from skilled therapeutic intervention in order to improve the following deficits and impairments:  Postural dysfunction, Decreased mobility, Decreased activity tolerance, Decreased range of motion, Decreased strength, Hypomobility, Impaired flexibility, Decreased balance, Difficulty walking  Visit  Diagnosis: Muscle weakness (generalized)  Gait difficulty     Problem List There are no active problems to display for this patient.  Pura Spice, PT, DPT # [id number] 12/13/2017, 7:57 AM  [place] [facility] [address] [place], [place], [address] Phone: [phone]   Fax:  [phone]  Name: Ross Odonnell MRN: [id number] Date of Birth: Apr 17, 1931

## 2019-09-27 ENCOUNTER — Ambulatory Visit: Payer: Medicare Other | Admitting: Physical Therapy

## 2019-10-04 ENCOUNTER — Other Ambulatory Visit: Payer: Self-pay

## 2019-10-04 ENCOUNTER — Encounter: Payer: Self-pay | Admitting: Physical Therapy

## 2019-10-04 ENCOUNTER — Ambulatory Visit: Payer: Medicare Other | Attending: Family Medicine | Admitting: Physical Therapy

## 2019-10-04 DIAGNOSIS — R269 Unspecified abnormalities of gait and mobility: Secondary | ICD-10-CM | POA: Diagnosis present

## 2019-10-04 DIAGNOSIS — M6281 Muscle weakness (generalized): Secondary | ICD-10-CM

## 2019-10-05 NOTE — Therapy (Addendum)
[place] [facility] [address]. [place], [place], [address] Phone: [phone]   Fax:  [phone]  Physical Therapy Evaluation  Patient Details  Name: Ross Odonnell MRN: [id number] Date of Birth: Oct 16, 1930 Referring Provider (PT): Dr. Danie Chandler   Encounter Date: 10/04/2019    PT End of Session - 10/05/19 1303    Visit Number  1    Number of Visits  9    Date for PT Re-Evaluation  11/01/19    Authorization - Visit Number  1    Authorization - Number of Visits  10    PT Start Time  0811    PT Stop Time  0901    PT Time Calculation (min)  50 min    Activity Tolerance  Patient tolerated treatment well    Behavior During Therapy  [facility] for tasks assessed/performed       Past Medical History:  Diagnosis Date  . Arthritis   . Diabetes mellitus without complication (HCC)   . Renal disorder     Past Surgical History:  Procedure Laterality Date  . HERNIA REPAIR    . LIVER CYST REMOVAL    . ROTATOR CUFF REPAIR      There were no vitals filed for this visit.   Subjective Assessment - 10/04/19 0831    Subjective  Pt. reports no knee pain but weakness, esp. with stairs/ steps on curb.    Pertinent History  Pt. lives at home with wife.  Pt. is retired and used to operate a Runner, broadcasting/film/video.  Pt. enjoys gardening.      Limitations  Standing;Walking;House hold activities    Patient Stated Goals  Increase B LE muscle strength.      Currently in Pain?  No/denies         [facility] PT Assessment - 10/05/19 0001      Assessment   Medical Diagnosis  B LE muscle weakness    Referring Provider (PT)  Dr. Danie Chandler    Onset Date/Surgical Date  05/04/19          See flowsheet    Objective measurements completed on examination: See above findings.     Supine SLR/ bridging 10x each.   Nustep L4 10 min. B UE/LE Discussed current ex. Program at home  PT will issue HEP next tx. session    PT Long Term Goals - 10/07/19 1758      PT LONG TERM  GOAL #1   Title  Pt. will be independent with HEP to increase B hip/knee MMT 1/2 muscle grade to improve daily mobility with household/ gardening tasks.    Baseline  Manual muscle testing: B quads 4+/5 MMT (R knee crepitus noted), B hamstrings 5/5 MMT, B hip adduction/ abduction 4+/5 MMT, B hip flexion 4-/5 MMT.    Time  4    Period  Weeks    Status  New    Target Date  11/01/19      PT LONG TERM GOAL #2   Title  Pt. will increase FOTO to 59 to improve daily functional mobility.    Baseline  40    Time  4    Period  Weeks    Status  New    Target Date  11/01/19      PT LONG TERM GOAL #3   Title  Pt. able to complete all aspects of gardening/ yardwork with no c/o B LE muscle weakness/ fatigue.      Baseline  c/o  difficulty with R>L LE with walking.  Pt. states legs give out/ fatigued.    Time  4    Period  Weeks    Status  New    Target Date  11/01/19             Plan - 10/07/19 1751    Clinical Impression Statement  Pt. is a pleasant 86 y/o male with chronic h/o LE muscle weakness.  Pt. has been to PT in past (2 years ago) for development of strengthening ex. program.  Pt. reports no pain in LE/knees at time of evaluation.  Manual muscle testing: B quads 4+/5 MMT (R knee crepitus noted), B hamstrings 5/5 MMT, B hip adduction/ abduction 4+/5 MMT, B hip flexion 4-/5 MMT.  Good B LE muscle sensation.  Pt. ambulates with consistent step pattern/ heel strike with no assistive device.  PT discussed using SPC on L to offset R knee instability during recip. gait pattern.  Supine SLR 10x (no symptoms).  R knee AROM: -5 to 124 deg.  L knee AROM: -2 to 125 deg.  FOTO: baseline 40/ goal 59.  Pt. able to STS with no UE assist and no c/o knee pain.  Pt. will benefit from short-term skilled PT services to develop home exercise/ strengthening program to improve LE muscle strength and independence with daily tasks.    Stability/Clinical Decision Making  Evolving/Moderate complexity    Clinical  Decision Making  Moderate    Rehab Potential  Good    PT Frequency  2x / week    PT Duration  4 weeks    PT Treatment/Interventions  ADLs/Self Care Home Management;Cryotherapy;Moist Heat;Gait training;Stair training;Functional mobility training;Therapeutic activities;Therapeutic exercise;Balance training;Patient/family education;Neuromuscular re-education;Manual techniques;Passive range of motion    PT Next Visit Plan  Issue HEP    Consulted and Agree with Plan of Care  Patient       Patient will benefit from skilled therapeutic intervention in order to improve the following deficits and impairments:  Postural dysfunction, Decreased mobility, Decreased activity tolerance, Decreased range of motion, Decreased strength, Hypomobility, Impaired flexibility, Decreased balance, Difficulty walking  Visit Diagnosis: Muscle weakness (generalized)  Gait difficulty     Problem List There are no problems to display for this patient.  Pura Spice, PT, DPT # [phone] 10/07/2019, 6:00 PM  [facility] [address] [place], [place], [address] Phone: [phone]   Fax:  [phone]  Name: Ross Odonnell MRN: [id number] Date of Birth: 10/23/1930

## 2019-10-07 NOTE — Addendum Note (Signed)
Addended by: Cammie Mcgee on: 10/07/2019 06:03 PM   Modules accepted: Orders

## 2019-10-08 ENCOUNTER — Ambulatory Visit: Payer: Medicare Other | Admitting: Physical Therapy

## 2019-10-08 ENCOUNTER — Encounter: Payer: Self-pay | Admitting: Physical Therapy

## 2019-10-08 ENCOUNTER — Other Ambulatory Visit: Payer: Self-pay

## 2019-10-08 DIAGNOSIS — M6281 Muscle weakness (generalized): Secondary | ICD-10-CM | POA: Diagnosis not present

## 2019-10-08 DIAGNOSIS — R269 Unspecified abnormalities of gait and mobility: Secondary | ICD-10-CM

## 2019-10-08 NOTE — Therapy (Signed)
[place] [facility] [address] [place], [place], [address] Phone: [phone]   Fax:  [phone]  Physical Therapy Treatment  Patient Details  Name: Ross Odonnell MRN: [id number] Date of Birth: 03-20-1931 Referring Provider (PT): Dr. Danie Chandler   Encounter Date: 10/08/2019  PT End of Session - 10/08/19 1231    Visit Number  2    Number of Visits  9    Date for PT Re-Evaluation  11/01/19    Authorization - Visit Number  2    Authorization - Number of Visits  10    PT Start Time  0810    PT Stop Time  0901    PT Time Calculation (min)  51 min    Activity Tolerance  Patient tolerated treatment well    Behavior During Therapy  [facility] for tasks assessed/performed       Past Medical History:  Diagnosis Date  . Arthritis   . Diabetes mellitus without complication (HCC)   . Renal disorder     Past Surgical History:  Procedure Laterality Date  . HERNIA REPAIR    . LIVER CYST REMOVAL    . ROTATOR CUFF REPAIR      There were no vitals filed for this visit.  Subjective Assessment - 10/08/19 1226    Subjective  Pt. reports no pain in knees.  Pt. states he has difficulty with step ups/ standing from low surfaces.    Pertinent History  Pt. lives at home with wife.  Pt. is retired and used to operate a Runner, broadcasting/film/video.  Pt. enjoys gardening.  Pt. drives.    Limitations  Standing;Walking;House hold activities    Patient Stated Goals  Increase B LE muscle strength.      Currently in Pain?  No/denies         There.ex.:  Nustep L5 10 min. B UE/LE (consistent cadence) Walking in //-bars (forward/ lateral) 4x each with mirror feedback. STS from gray chair 10x (no UE) 6" step ups/ step back with eccentric quad control 10x L/R See new HEP (handouts issued).     PT Education - 10/08/19 1230    Education Details  See HEP    Person(s) Educated  Patient    Methods  Explanation;Demonstration;Handout    Comprehension  Verbalized understanding;Returned  demonstration          PT Long Term Goals - 10/07/19 1758      PT LONG TERM GOAL #1   Title  Pt. will be independent with HEP to increase B hip/knee MMT 1/2 muscle grade to improve daily mobility with household/ gardening tasks.    Baseline  Manual muscle testing: B quads 4+/5 MMT (R knee crepitus noted), B hamstrings 5/5 MMT, B hip adduction/ abduction 4+/5 MMT, B hip flexion 4-/5 MMT.    Time  4    Period  Weeks    Status  New    Target Date  11/01/19      PT LONG TERM GOAL #2   Title  Pt. will increase FOTO to 59 to improve daily functional mobility.    Baseline  40    Time  4    Period  Weeks    Status  New    Target Date  11/01/19      PT LONG TERM GOAL #3   Title  Pt. able to complete all aspects of gardening/ yardwork with no c/o B LE muscle weakness/ fatigue.      Baseline  c/o  difficulty with R>L LE with walking.  Pt. states legs give out/ fatigued.    Time  4    Period  Weeks    Status  New    Target Date  11/01/19         Plan - 10/08/19 1230    Clinical Impression Statement  Pt. worked hard with PT tx. session and demonstrates good understanding of new HEP.  Pt. did have difficulty with step backs/ eccentric quad control on 6" step in //-bars due to muscle weakness. Increase resistance with Nustep and minimal UE assist.  See new HEP.    Stability/Clinical Decision Making  Evolving/Moderate complexity    Clinical Decision Making  Moderate    Rehab Potential  Good    PT Frequency  2x / week    PT Duration  4 weeks    PT Treatment/Interventions  ADLs/Self Care Home Management;Cryotherapy;Moist Heat;Gait training;Stair training;Functional mobility training;Therapeutic activities;Therapeutic exercise;Balance training;Patient/family education;Neuromuscular re-education;Manual techniques;Passive range of motion    PT Next Visit Plan  Progress hip/quad strengthening ex. program.    PT Home Exercise Plan  [email or web address]    Consulted and Agree with Plan of Care  Patient        Patient will benefit from skilled therapeutic intervention in order to improve the following deficits and impairments:  Postural dysfunction, Decreased mobility, Decreased activity tolerance, Decreased range of motion, Decreased strength, Hypomobility, Impaired flexibility, Decreased balance, Difficulty walking  Visit Diagnosis: Muscle weakness (generalized)  Gait difficulty     Problem List There are no problems to display for this patient.   Pura Spice, PT, DPT # [phone] 10/08/2019, 12:35 PM  [place] [facility] [address] [place], [place], [address] Phone: [phone]   Fax:  [phone]  Name: Ross Odonnell MRN: [id number] Date of Birth: 20-Jan-1931

## 2019-10-08 NOTE — Patient Instructions (Signed)
Access Code: [email or web address]: [email or web address]: 06/07/2021Prepared by: Casimiro Needle SherkExercises  Supine Quad Set - 1 x daily - 7 x weekly - 1 sets - 10 reps - 10 seconds hold  Supine Active Straight Leg Raise - 1 x daily - 7 x weekly - 1 sets - 20 reps  Mini Lunge - 1 x daily - 7 x weekly - 2 sets - 10 reps  Single Leg Stance with Support - 1 x daily - 7 x weekly - 1 sets - 3 reps - 30 seconds hold  Sit to Stand without Arm Support - 1 x daily - 7 x weekly - 2 sets - 10 reps

## 2019-10-10 ENCOUNTER — Ambulatory Visit: Payer: Medicare Other | Admitting: Physical Therapy

## 2019-10-10 ENCOUNTER — Encounter: Payer: Self-pay | Admitting: Physical Therapy

## 2019-10-10 ENCOUNTER — Other Ambulatory Visit: Payer: Self-pay

## 2019-10-10 DIAGNOSIS — R269 Unspecified abnormalities of gait and mobility: Secondary | ICD-10-CM

## 2019-10-10 DIAGNOSIS — M6281 Muscle weakness (generalized): Secondary | ICD-10-CM

## 2019-10-10 NOTE — Therapy (Signed)
[place] [facility] [address] [place], [place], [address] Phone: [phone]   Fax:  [phone]  Physical Therapy Treatment  Patient Details  Name: Ross Odonnell MRN: [id number] Date of Birth: 19-Jul-1930 Referring Provider (PT): Dr. Foye Deer   Encounter Date: 10/10/2019  PT End of Session - 10/10/19 7253    Visit Number  3    Number of Visits  9    Date for PT Re-Evaluation  11/01/19    Authorization - Visit Number  3    Authorization - Number of Visits  10    PT Start Time  0813    PT Stop Time  0902    PT Time Calculation (min)  49 min    Activity Tolerance  Patient tolerated treatment well    Behavior During Therapy  [facility] for tasks assessed/performed       Past Medical History:  Diagnosis Date  . Arthritis   . Diabetes mellitus without complication ([place])   . Renal disorder     Past Surgical History:  Procedure Laterality Date  . HERNIA REPAIR    . LIVER CYST REMOVAL    . ROTATOR CUFF REPAIR      There were no vitals filed for this visit.  Subjective Assessment - 10/10/19 0820    Subjective  Pt reports no pain in knees. Pt states he has been busy and has not had time to perform HEP. Pt reports minor soreness after previous session.    Pertinent History  Pt. lives at home with wife.  Pt. is retired and used to operate a Furniture conservator/restorer.  Pt. enjoys gardening.  Pt. drives.    Limitations  Standing;Walking;House hold activities    Patient Stated Goals  Increase B LE muscle strength.      Currently in Pain?  No/denies      There.ex:   Nu-Step- 11 min L5, 10 min, seat position/arms setting 10 Mini lunges with UE support in //-bars 10x each Tandem stance in // bars: x4 SLS bilateral: x3 no UE support Mini lunge in // bars: x4 STS: 2x10, 1x10, holding 2 lbs DB  Side-step 6" step-up: 2x10 (UE support for R step-up)  Heel raises: 2x20 Discussed/ reviewed HEP   PT Long Term Goals - 10/07/19 1758      PT LONG TERM GOAL #1   Title  Pt. will be independent with HEP to increase B hip/knee MMT 1/2 muscle grade to improve daily mobility with household/ gardening tasks.    Baseline  Manual muscle testing: B quads 4+/5 MMT (R knee crepitus noted), B hamstrings 5/5 MMT, B hip adduction/ abduction 4+/5 MMT, B hip flexion 4-/5 MMT.    Time  4    Period  Weeks    Status  New    Target Date  11/01/19      PT LONG TERM GOAL #2   Title  Pt. will increase FOTO to 59 to improve daily functional mobility.    Baseline  40    Time  4    Period  Weeks    Status  New    Target Date  11/01/19      PT LONG TERM GOAL #3   Title  Pt. able to complete all aspects of gardening/ yardwork with no c/o B LE muscle weakness/ fatigue.      Baseline  c/o difficulty with R>L LE with walking.  Pt. states legs give out/ fatigued.    Time  4  Period  Weeks    Status  New    Target Date  11/01/19            Plan - 10/10/19 0902    Clinical Impression Statement  Pt stated due to busy schedule he was unable to perform HEP between sessions. Pt displayed no knee pain today. Pt was able to progress LE resistance exercises with 2 lbs DB to STS and also improved balance abilities with tolerating SLS for 3x30 seconds with no UE support. With lateral step-ups pt required UE support stepping up with RLE, and required no UE support for LLE. Pt continues to progress with balance and LE resistance however continues to have greater strength deficits in RLE specifically in knee and hip compared to LLE. Pt can continue to benefit from skilled PT treatment to improve BLE strength to improve confidence and safety with ADL's.    Stability/Clinical Decision Making  Evolving/Moderate complexity    Rehab Potential  Good    PT Frequency  2x / week    PT Duration  4 weeks    PT Treatment/Interventions  ADLs/Self Care Home Management;Cryotherapy;Moist Heat;Gait training;Stair training;Functional mobility training;Therapeutic activities;Therapeutic  exercise;Balance training;Patient/family education;Neuromuscular re-education;Manual techniques;Passive range of motion    PT Next Visit Plan  Progress hip/quad strengthening ex. program.    PT Home Exercise Plan  [email or web address]    Consulted and Agree with Plan of Care  Patient       Patient will benefit from skilled therapeutic intervention in order to improve the following deficits and impairments:  Postural dysfunction, Decreased mobility, Decreased activity tolerance, Decreased range of motion, Decreased strength, Hypomobility, Impaired flexibility, Decreased balance, Difficulty walking  Visit Diagnosis: Muscle weakness (generalized)  Gait difficulty     Problem List There are no problems to display for this patient.  Cammie Mcgee, PT, DPT # [phone] 10/10/2019, 9:15 AM  [place] [facility] [address] [place], [place], [address] Phone: [phone]   Fax:  [phone]  Name: Ross Odonnell MRN: [id number] Date of Birth: 06/26/1930

## 2019-10-15 ENCOUNTER — Ambulatory Visit: Payer: Medicare Other | Admitting: Physical Therapy

## 2019-10-15 ENCOUNTER — Other Ambulatory Visit: Payer: Self-pay

## 2019-10-15 ENCOUNTER — Encounter: Payer: Self-pay | Admitting: Physical Therapy

## 2019-10-15 DIAGNOSIS — M6281 Muscle weakness (generalized): Secondary | ICD-10-CM

## 2019-10-15 DIAGNOSIS — R269 Unspecified abnormalities of gait and mobility: Secondary | ICD-10-CM

## 2019-10-15 NOTE — Therapy (Signed)
[place] [facility] [address] [place], [place], [address] Phone: [phone]   Fax:  [phone]  Physical Therapy Treatment  Patient Details  Name: Ross Odonnell MRN: [id number] Date of Birth: Aug 28, 1930 Referring Provider (PT): Dr. Foye Deer   Encounter Date: 10/15/2019   PT End of Session - 10/15/19 0905    Visit Number 4    Number of Visits 9    Date for PT Re-Evaluation 11/01/19    Authorization - Visit Number 4    Authorization - Number of Visits 10    PT Start Time 0807    PT Stop Time 0858    PT Time Calculation (min) 51 min    Activity Tolerance Patient tolerated treatment well    Behavior During Therapy [facility] for tasks assessed/performed           Past Medical History:  Diagnosis Date  . Arthritis   . Diabetes mellitus without complication ([place])   . Renal disorder     Past Surgical History:  Procedure Laterality Date  . HERNIA REPAIR    . LIVER CYST REMOVAL    . ROTATOR CUFF REPAIR      There were no vitals filed for this visit.   Subjective Assessment - 10/15/19 0904    Subjective Pt ambulated into clinic with SPC. No c/o of knee pain prior to treatment. Pt states he has been compliant with HEP and that it remains challenging.    Pertinent History Pt. lives at home with wife.  Pt. is retired and used to operate a Furniture conservator/restorer.  Pt. enjoys gardening.  Pt. drives.    Limitations Standing;Walking;House hold activities    Patient Stated Goals Increase B LE muscle strength.      Currently in Pain? No/denies    Multiple Pain Sites No            There.ex:   Nu-Step: L5 for 8:30, L6 for 1:30.  B UE/LE (discussed weekend activities).. 4-way resisted walking at Nautilus machine: 1x3, 30# forward/backwards/R lateral. 40# side-stepping to the L STS at TRX : 2x10 with chair behind pt for TC.  Cuing for proper technique.  Heel raises: 1x20 (at shelving unit with light UE assist).    Neuro Re-Ed:   B SLS on foam: 3x30  sec in // bars B side-steps/tandem stance on foam: 1x4 each in // bars     PT Education - 10/15/19 0904    Education Details Pt educated on STS progression to tapping glutes on seat then standing instead of sitting in seat prior to standing.    Person(s) Educated Patient    Methods Explanation;Demonstration    Comprehension Verbalized understanding;Returned demonstration               PT Long Term Goals - 10/07/19 1758      PT LONG TERM GOAL #1   Title Pt. will be independent with HEP to increase B hip/knee MMT 1/2 muscle grade to improve daily mobility with household/ gardening tasks.    Baseline Manual muscle testing: B quads 4+/5 MMT (R knee crepitus noted), B hamstrings 5/5 MMT, B hip adduction/ abduction 4+/5 MMT, B hip flexion 4-/5 MMT.    Time 4    Period Weeks    Status New    Target Date 11/01/19      PT LONG TERM GOAL #2   Title Pt. will increase FOTO to 59 to improve daily functional mobility.    Baseline 40  Time 4    Period Weeks    Status New    Target Date 11/01/19      PT LONG TERM GOAL #3   Title Pt. able to complete all aspects of gardening/ yardwork with no c/o B LE muscle weakness/ fatigue.      Baseline c/o difficulty with R>L LE with walking.  Pt. states legs give out/ fatigued.    Time 4    Period Weeks    Status New    Target Date 11/01/19               Plan - 10/15/19 0906    Clinical Impression Statement Pt tolerated progression of tandem stance and SLS from flat ground to foam with noticeable challenge to ankle and hip strategies with increased difficulty primarily in SLS. Pt performed 4 way resisted walking on Nautilus to further challenge balance and improve BLE strength in hips and knees. Pt displayed ability to eccentricly control weight with no LOB. Pt performed STS progression on TRX. Pt displayed use of B biceps for standing and required min VC's to push feet through ground to focus on hip/knee extension with standing. Pt  demonstrated improved carryover after verbal cuing during tx.    Stability/Clinical Decision Making Evolving/Moderate complexity    Clinical Decision Making Moderate    Rehab Potential Good    PT Frequency 2x / week    PT Duration 4 weeks    PT Treatment/Interventions ADLs/Self Care Home Management;Cryotherapy;Moist Heat;Gait training;Stair training;Functional mobility training;Therapeutic activities;Therapeutic exercise;Balance training;Patient/family education;Neuromuscular re-education;Manual techniques;Passive range of motion    PT Next Visit Plan Progress hip/quad strengthening ex. program.  Work on dual tasking next tx.    PT Home Exercise Plan [email or web address]    Consulted and Agree with Plan of Care Patient           Patient will benefit from skilled therapeutic intervention in order to improve the following deficits and impairments:  Postural dysfunction, Decreased mobility, Decreased activity tolerance, Decreased range of motion, Decreased strength, Hypomobility, Impaired flexibility, Decreased balance, Difficulty walking  Visit Diagnosis: Muscle weakness (generalized)  Gait difficulty     Problem List There are no problems to display for this patient.  Cammie Mcgee, PT, DPT # [address] SPT 10/15/2019, 9:42 AM  [facility] [address] [place], [place], [address] Phone: [phone]   Fax:  [phone]  Name: AUBERT CHOYCE MRN: [id number] Date of Birth: [birth year]

## 2019-10-17 ENCOUNTER — Encounter: Payer: Self-pay | Admitting: Physical Therapy

## 2019-10-17 ENCOUNTER — Ambulatory Visit: Payer: Medicare Other | Admitting: Physical Therapy

## 2019-10-17 ENCOUNTER — Other Ambulatory Visit: Payer: Self-pay

## 2019-10-17 DIAGNOSIS — M6281 Muscle weakness (generalized): Secondary | ICD-10-CM | POA: Diagnosis not present

## 2019-10-17 DIAGNOSIS — R269 Unspecified abnormalities of gait and mobility: Secondary | ICD-10-CM

## 2019-10-17 NOTE — Therapy (Signed)
[place] [facility] [address] [place], [place], [address] Phone: [phone]   Fax:  [phone]  Physical Therapy Treatment  Patient Details  Name: Ross Odonnell MRN: [id number] Date of Birth: Sep 18, 1930 Referring Provider (PT): Dr. Danie Chandler   Encounter Date: 10/17/2019   PT End of Session - 10/17/19 0950    Visit Number 5    Number of Visits 9    Date for PT Re-Evaluation 11/01/19    Authorization - Visit Number 5    Authorization - Number of Visits 10    PT Start Time 0900    PT Stop Time 0945    PT Time Calculation (min) 45 min    Activity Tolerance Patient tolerated treatment well    Behavior During Therapy [facility] for tasks assessed/performed           Past Medical History:  Diagnosis Date   Arthritis    Diabetes mellitus without complication (HCC)    Renal disorder     Past Surgical History:  Procedure Laterality Date   HERNIA REPAIR     LIVER CYST REMOVAL     ROTATOR CUFF REPAIR      There were no vitals filed for this visit.   Subjective Assessment - 10/17/19 0949    Subjective Pt ambulated into clinic this a.m. with no use of SPC. Pt reports he forgot it. No c/o falls. Pt reports being compliant with parts of HEP. NO pain today prior to treatment. Pt reports soreness from previous sessions.    Pertinent History Pt. lives at home with wife.  Pt. is retired and used to operate a Runner, broadcasting/film/video.  Pt. enjoys gardening.  Pt. drives.    Limitations Standing;Walking;House hold activities    Patient Stated Goals Increase B LE muscle strength.      Currently in Pain? No/denies    Multiple Pain Sites No          There.ex:   Nu-Step: L6 for 10 min with UE and LE use. Last 2 minutes focused on maintaining Watts above 50 to focus on BLE power output.  TRX STS: 1x12 with use of chair behind pt for safety and use as a tactile cue. Min VC's used to reduce use of biceps to assist in standing with improved carryover of BLE use  after cue.  STS with B 4 lbs DB's in hands: 1x12, requiring min verbal and tactile cues for upright posture. Total Gym: Single leg leg press: 2x12 LLE, RLE required 2 leg press, then RLE single leg descent due to increased R quad/glute weakness.  // bars:    4lbs ankle weights: side stepping over hurdles (3 small) x4 4 lbs ankle weights B standing hip abduction: 2x8 4 lbs ankle weights B standing hip extension: 1x8   PT Education - 10/17/19 0950    Education Details Pt educated on AROM of BLE's to reduce DOMS.    Person(s) Educated Patient    Methods Explanation;Demonstration    Comprehension Verbalized understanding               PT Long Term Goals - 10/07/19 1758      PT LONG TERM GOAL #1   Title Pt. will be independent with HEP to increase B hip/knee MMT 1/2 muscle grade to improve daily mobility with household/ gardening tasks.    Baseline Manual muscle testing: B quads 4+/5 MMT (R knee crepitus noted), B hamstrings 5/5 MMT, B hip adduction/ abduction 4+/5 MMT, B hip flexion  4-/5 MMT.    Time 4    Period Weeks    Status New    Target Date 11/01/19      PT LONG TERM GOAL #2   Title Pt. will increase FOTO to 59 to improve daily functional mobility.    Baseline 40    Time 4    Period Weeks    Status New    Target Date 11/01/19      PT LONG TERM GOAL #3   Title Pt. able to complete all aspects of gardening/ yardwork with no c/o B LE muscle weakness/ fatigue.      Baseline c/o difficulty with R>L LE with walking.  Pt. states legs give out/ fatigued.    Time 4    Period Weeks    Status New    Target Date 11/01/19            Plan - 10/17/19 0951    Clinical Impression Statement Pt displayed no LOB with ambulating into clinic without use of SPC. Pt tolerated BLE strengthening exercise progression well. Pt required min verbal cues and tactile cues for STS progression for tapping buttocks on seat as cue as opposed to sitting down in seat. In total gym pt displayed R  sided LE eccentric weakness compared to L with noticeable increase in difficulty displaying shakiness and R hip IR and adduction displaying R hip weakness and quad weakness. Pt can continue to benefit from improving RLE strength = to LLE strength to improve pt's abilities to perform ADL's and reduce risk of falls.    Stability/Clinical Decision Making Evolving/Moderate complexity    Rehab Potential Good    PT Frequency 2x / week    PT Duration 4 weeks    PT Treatment/Interventions ADLs/Self Care Home Management;Cryotherapy;Moist Heat;Gait training;Stair training;Functional mobility training;Therapeutic activities;Therapeutic exercise;Balance training;Patient/family education;Neuromuscular re-education;Manual techniques;Passive range of motion    PT Next Visit Plan Progress hip/quad strengthening ex. program.  Work on dual tasking next tx.    PT Home Exercise Plan [email or web address]    Consulted and Agree with Plan of Care Patient           Patient will benefit from skilled therapeutic intervention in order to improve the following deficits and impairments:  Postural dysfunction, Decreased mobility, Decreased activity tolerance, Decreased range of motion, Decreased strength, Hypomobility, Impaired flexibility, Decreased balance, Difficulty walking  Visit Diagnosis: Muscle weakness (generalized)  Gait difficulty     Problem List There are no problems to display for this patient.  Pura Spice, PT, DPT # [address] SPT 10/18/2019, 8:09 AM  [facility] [address] [place], [place], [address] Phone: [phone]   Fax:  [phone]  Name: VERN GUERETTE MRN: [id number] Date of Birth: Aug 19, 1930

## 2019-10-22 ENCOUNTER — Encounter: Payer: Self-pay | Admitting: Physical Therapy

## 2019-10-22 ENCOUNTER — Other Ambulatory Visit: Payer: Self-pay

## 2019-10-22 ENCOUNTER — Ambulatory Visit: Payer: Medicare Other | Admitting: Physical Therapy

## 2019-10-22 DIAGNOSIS — M6281 Muscle weakness (generalized): Secondary | ICD-10-CM | POA: Diagnosis not present

## 2019-10-22 DIAGNOSIS — R269 Unspecified abnormalities of gait and mobility: Secondary | ICD-10-CM

## 2019-10-22 NOTE — Therapy (Signed)
[place] [facility] [address] [place], [place], [address] Phone: [phone]   Fax:  [phone]  Physical Therapy Treatment  Patient Details  Name: Ross Odonnell MRN: [id number] Date of Birth: Dec 03, 1930 Referring Provider (PT): Dr. Danie Chandler   Encounter Date: 10/22/2019   PT End of Session - 10/22/19 0855    Visit Number 6    Number of Visits 9    Date for PT Re-Evaluation 11/01/19    Authorization - Visit Number 6    Authorization - Number of Visits 10    PT Start Time 0752    PT Stop Time 0846    PT Time Calculation (min) 54 min    Activity Tolerance Patient tolerated treatment well    Behavior During Therapy [facility] for tasks assessed/performed           Past Medical History:  Diagnosis Date  . Arthritis   . Diabetes mellitus without complication (HCC)   . Renal disorder     Past Surgical History:  Procedure Laterality Date  . HERNIA REPAIR    . LIVER CYST REMOVAL    . ROTATOR CUFF REPAIR      There were no vitals filed for this visit.   Subjective Assessment - 10/22/19 0853    Subjective Pt reports HEP is becoming easier. Pt reports mild soreness after previous session. No reports of knee pain prior to session this a.m. Pt asked for B knee imaging results asking about joint narrowing and if it indicates knee OA.    Pertinent History Pt. lives at home with wife.  Pt. is retired and used to operate a Runner, broadcasting/film/video.  Pt. enjoys gardening.  Pt. drives.    Limitations Standing;Walking;House hold activities    Patient Stated Goals Increase B LE muscle strength.      Currently in Pain? No/denies            There.ex:   Nu Step L6: 8 min maintaining watts over 50 Resisted gait in Nautilus: 60 lbs forward/backwards/side-steps R and L: x1 // bars: x4 walking lunges with BUE touch support. Required verbal and tactile cues to improve technique for safe biomechanics of movement.  // bars: B standing hip abd/ext with 5 lbs ankle  weights. 1x3, using mirror as visual feedback for correct posture. Required therapist verbal and tactile cues when standing on RLE due to increased R hip weakness compared to L.   Neuro Re-Ed:   Resisted gait in nautilus: 80 lbs x3, stepping over hurdles to challenge dynamic balance and improve B step-length, x3 cone taps with gait to improve dynamic balance in single leg stance.    PT Education - 10/22/19 0854    Education Details Pt educated on updated HEP of resisted standing hip abduction/extension and weighted sit to stands. Pt educated on benefits of resistance exercise for knee OA.    Person(s) Educated Patient    Methods Explanation;Demonstration    Comprehension Verbalized understanding;Returned demonstration               PT Long Term Goals - 10/07/19 1758      PT LONG TERM GOAL #1   Title Pt. will be independent with HEP to increase B hip/knee MMT 1/2 muscle grade to improve daily mobility with household/ gardening tasks.    Baseline Manual muscle testing: B quads 4+/5 MMT (R knee crepitus noted), B hamstrings 5/5 MMT, B hip adduction/ abduction 4+/5 MMT, B hip flexion 4-/5 MMT.    Time 4  Period Weeks    Status New    Target Date 11/01/19      PT LONG TERM GOAL #2   Title Pt. will increase FOTO to 59 to improve daily functional mobility.    Baseline 40    Time 4    Period Weeks    Status New    Target Date 11/01/19      PT LONG TERM GOAL #3   Title Pt. able to complete all aspects of gardening/ yardwork with no c/o B LE muscle weakness/ fatigue.      Baseline c/o difficulty with R>L LE with walking.  Pt. states legs give out/ fatigued.    Time 4    Period Weeks    Status New    Target Date 11/01/19             Plan - 10/22/19 0856    Clinical Impression Statement Pt demonstrating improvements with dynamic balance and form with resistance exercises. Pt demonstrated ability to perform resisted walking with 80 lbs at Nautilus machine while ambulating  over 3 hurdles and toe taps on cones. Pt demonstrated increased difficulty with side stepping with resisted walking requiring min verbal and tactile cues for correct posture to target B lateral hip muscles. In // bars pt demonstrated ability to perform walking lunges with increased depth requiring BUE finger touch support. With resisted hip abduction and extension pt able to tolerate 5 lbs resistance compared to 4 lbs at previous session with subjective report of increased fatigue and compensation patterns requiring therapist tactile and verbal cues to correct. Pt demonstrated increased difficutly on RLE due to muscule weakness comapred to LLE. Pt can benefit from continued skilled PT to improve BLE strengthening.    Stability/Clinical Decision Making Evolving/Moderate complexity    Rehab Potential Good    PT Frequency 2x / week    PT Duration 4 weeks    PT Treatment/Interventions ADLs/Self Care Home Management;Cryotherapy;Moist Heat;Gait training;Stair training;Functional mobility training;Therapeutic activities;Therapeutic exercise;Balance training;Patient/family education;Neuromuscular re-education;Manual techniques;Passive range of motion    PT Next Visit Plan Work on dual tasking next tx.  Single leg strengthening (R side)    PT Home Exercise Plan [email or web address]    Consulted and Agree with Plan of Care Patient           Patient will benefit from skilled therapeutic intervention in order to improve the following deficits and impairments:  Postural dysfunction, Decreased mobility, Decreased activity tolerance, Decreased range of motion, Decreased strength, Hypomobility, Impaired flexibility, Decreased balance, Difficulty walking  Visit Diagnosis: Muscle weakness (generalized)  Gait difficulty     Problem List There are no problems to display for this patient.  Pura Spice, PT, DPT # [id number], SPT 10/22/2019, 9:53 AM  [place] [facility] [address] [place], [place], [address] Phone: [phone]   Fax:  [phone]  Name: LEVELL TAVANO MRN: [id number] Date of Birth: 1931/05/02

## 2019-10-24 ENCOUNTER — Ambulatory Visit: Payer: Medicare Other | Admitting: Physical Therapy

## 2019-10-24 ENCOUNTER — Encounter: Payer: Self-pay | Admitting: Physical Therapy

## 2019-10-24 ENCOUNTER — Other Ambulatory Visit: Payer: Self-pay

## 2019-10-24 DIAGNOSIS — M6281 Muscle weakness (generalized): Secondary | ICD-10-CM | POA: Diagnosis not present

## 2019-10-24 DIAGNOSIS — R269 Unspecified abnormalities of gait and mobility: Secondary | ICD-10-CM

## 2019-10-24 NOTE — Therapy (Signed)
[facility] [address] [place], [place], [address] Phone: [phone]   Fax:  [phone]  Physical Therapy Treatment  Patient Details  Name: Ross Odonnell MRN: [id number] Date of Birth: 1930-05-09 Referring Provider (PT): Dr. Danie Chandler   Encounter Date: 10/24/2019   PT End of Session - 10/24/19 0914    Visit Number 7    Number of Visits 9    Date for PT Re-Evaluation 11/01/19    Authorization - Visit Number 7    Authorization - Number of Visits 10    PT Start Time 0805    PT Stop Time 0855    PT Time Calculation (min) 50 min    Activity Tolerance Patient tolerated treatment well    Behavior During Therapy [facility] for tasks assessed/performed           Past Medical History:  Diagnosis Date  . Arthritis   . Diabetes mellitus without complication (HCC)   . Renal disorder     Past Surgical History:  Procedure Laterality Date  . HERNIA REPAIR    . LIVER CYST REMOVAL    . ROTATOR CUFF REPAIR      There were no vitals filed for this visit.   Subjective Assessment - 10/24/19 0913    Subjective Pt reports he believes he is progressing well with PT and believes he is close to being ready for discharge from therapy. Compliant with HEP.    Pertinent History Pt. lives at home with wife.  Pt. is retired and used to operate a Runner, broadcasting/film/video.  Pt. enjoys gardening.  Pt. drives.    Limitations Standing;Walking;House hold activities    Patient Stated Goals Increase B LE muscle strength.      Currently in Pain? No/denies    Multiple Pain Sites No          There.ex:   Nu-step: L6 for 6 min use of UE's/LE's STS with airex pad on seat as tactile cue; 2x12 STS with no airex pad in seat. Required tactile cues to maintain upright posture and hip hinge to seat 6" step-ups at // bars on RLE: 1x8 with compensation of R rotation to use quads instead of hip muscles. 3" step ups at // bars on RLE: 2x10 with improved technique and less R rotation  compensation, use of mirror as visual feedback.  Neuro Re-Ed:   Resisted walking at Nautilus: 60 lbs, x4 walking forward/side-steps to R and L/ marching forward, reciprocal cone taps SLS on airex pad with min finger touch on // bars: 2x30     PT Long Term Goals - 10/07/19 1758      PT LONG TERM GOAL #1   Title Pt. will be independent with HEP to increase B hip/knee MMT 1/2 muscle grade to improve daily mobility with household/ gardening tasks.    Baseline Manual muscle testing: B quads 4+/5 MMT (R knee crepitus noted), B hamstrings 5/5 MMT, B hip adduction/ abduction 4+/5 MMT, B hip flexion 4-/5 MMT.    Time 4    Period Weeks    Status New    Target Date 11/01/19      PT LONG TERM GOAL #2   Title Pt. will increase FOTO to 59 to improve daily functional mobility.    Baseline 40    Time 4    Period Weeks    Status New    Target Date 11/01/19      PT LONG TERM GOAL #3   Title Pt.  able to complete all aspects of gardening/ yardwork with no c/o B LE muscle weakness/ fatigue.      Baseline c/o difficulty with R>L LE with walking.  Pt. states legs give out/ fatigued.    Time 4    Period Weeks    Status New    Target Date 11/01/19            Plan - 10/24/19 0915    Clinical Impression Statement Pt demonstrates improved form with STS technique with added resistance of medicine ball. Pt continues to display slight increase weakness on RLE compared to left particularly in hip region. Pt required mod verbal and tactile cues for target lateral hip exercises 2/2 pt rotating as compensation to use quads instead of glut med. Pt demonstrates ability to maintaing SLS on airex pad with 30 sec with min hand touch assist on // bars displaying improvements in dynamic balance.    Stability/Clinical Decision Making Evolving/Moderate complexity    Rehab Potential Good    PT Frequency 2x / week    PT Duration 4 weeks    PT Treatment/Interventions ADLs/Self Care Home Management;Cryotherapy;Moist  Heat;Gait training;Stair training;Functional mobility training;Therapeutic activities;Therapeutic exercise;Balance training;Patient/family education;Neuromuscular re-education;Manual techniques;Passive range of motion    PT Next Visit Plan Work on dual tasking next tx.  Single leg strengthening (R side).  RECERT after 7/1    PT Home Exercise Plan [email or web address]    Consulted and Agree with Plan of Care Patient           Patient will benefit from skilled therapeutic intervention in order to improve the following deficits and impairments:  Postural dysfunction, Decreased mobility, Decreased activity tolerance, Decreased range of motion, Decreased strength, Hypomobility, Impaired flexibility, Decreased balance, Difficulty walking  Visit Diagnosis: Muscle weakness (generalized)  Gait difficulty     Problem List There are no problems to display for this patient.  Pura Spice, PT, DPT # [id number] [address] Daughters, SPT 10/25/2019, 11:22 AM  [facility] [facility] [address] [place], [place], [address] Phone: [phone]   Fax:  [phone]  Name: Ross Odonnell MRN: [id number] Date of Birth: 1931-04-19

## 2019-10-31 ENCOUNTER — Encounter: Payer: Self-pay | Admitting: Physical Therapy

## 2019-10-31 ENCOUNTER — Other Ambulatory Visit: Payer: Self-pay

## 2019-10-31 ENCOUNTER — Ambulatory Visit: Payer: Medicare Other | Admitting: Physical Therapy

## 2019-10-31 DIAGNOSIS — M6281 Muscle weakness (generalized): Secondary | ICD-10-CM | POA: Diagnosis not present

## 2019-10-31 DIAGNOSIS — R269 Unspecified abnormalities of gait and mobility: Secondary | ICD-10-CM

## 2019-10-31 NOTE — Therapy (Signed)
[place] [address] [place], [place], [address] Phone: [phone]   Fax:  [phone]  Physical Therapy Treatment  Patient Details  Name: Ross Odonnell MRN: [id number] Date of Birth: 11/01/1930 Referring Provider (PT): Dr. Foye Deer   Encounter Date: 10/31/2019   PT End of Session - 10/31/19 1225    Visit Number 8    Number of Visits 9    Date for PT Re-Evaluation 11/01/19    Authorization - Visit Number 8    Authorization - Number of Visits 10    PT Start Time 0859    PT Stop Time 0943    PT Time Calculation (min) 44 min    Activity Tolerance Patient tolerated treatment well    Behavior During Therapy [facility] for tasks assessed/performed           Past Medical History:  Diagnosis Date   Arthritis    Diabetes mellitus without complication ([place])    Renal disorder     Past Surgical History:  Procedure Laterality Date   HERNIA REPAIR     LIVER CYST REMOVAL     ROTATOR CUFF REPAIR      There were no vitals filed for this visit.   Subjective Assessment - 10/31/19 1222    Subjective Pt presents with c/o needing a R knee TKA due to imaging resulting in diagnosis of B knee OA. Pt presents with no pain and states he believes he is ready to be discharged from PT soon.    Pertinent History Pt. lives at home with wife.  Pt. is retired and used to operate a Furniture conservator/restorer.  Pt. enjoys gardening.  Pt. drives.    Limitations Standing;Walking;House hold activities    Patient Stated Goals Increase B LE muscle strength.      Currently in Pain? No/denies          There.ex:   Nu-step L5 for 10 min use of UE's/LE's. Reassessed FOTO while on Nu-step  Standing B hip abduction/marching/extension: 2x12, 5 lbs ankle weights  Reassessed BLE MMT goal: Quads and hip flexors improved. Quads from 4+/5 to 5/5 bilaterally. Hip flexors from 4-/5 to 4/5 bilaterally.   Pt reports accomplishing gardening goal of no c/o BLE muscle weakness or  fatigue with gardening tasks. Just increased time to perform.   Pt educated on current literature of best treatment options for Knee OA. Education on benefits of resistance exercise and cardiopulmonary fitness in the management of OA.   Neuro:  Amb. In clinic/ outside on varying terrain (updated goals).      PT Education - 10/31/19 1223    Education Details Pt educated on importance of mobility and resistance exercise for knee OA. Pt educated that as long as his knee function is not severely affecting his ability to independently perform ADL's and he is not having pain that TKA is not recommended from a PT stand point.    Person(s) Educated Patient    Methods Explanation    Comprehension Verbalized understanding               PT Long Term Goals - 10/31/19 1230      PT LONG TERM GOAL #1   Title Pt. will be independent with HEP to increase B hip/knee MMT 1/2 muscle grade to improve daily mobility with household/ gardening tasks.    Baseline Manual muscle testing: B quads 4+/5 MMT (R knee crepitus noted), B hamstrings 5/5 MMT, B hip adduction/ abduction 4+/5 MMT, B  hip flexion 4-/5 MMT.; 10/31/2019 B hip flexion 4/5 and B quads 5/5    Time 4    Period Weeks    Status Partially Met    Target Date 11/01/19      PT LONG TERM GOAL #2   Title Pt. will increase FOTO to 59 to improve daily functional mobility.    Baseline 40; 10/31/2019 67    Time 4    Period Weeks    Status Achieved    Target Date 10/31/19      PT LONG TERM GOAL #3   Title Pt. able to complete all aspects of gardening/ yardwork with no c/o B LE muscle weakness/ fatigue.      Baseline c/o difficulty with R>L LE with walking.  Pt. states legs give out/ fatigued.; 10/31/2019 no difficulty with walking and working in garden    Time 4    Period Weeks    Status Achieved    Target Date 10/31/19              Plan - 10/31/19 1225    Clinical Impression Statement Pt's goals reassesed due to having two last scheduled  visits remaining. Pt improved FOTO score to 67, with the goal of reaching 59 displaying improved ability to perform ADL's at home. Pt also improved MMT beyond half a grade with quads from a 4+/5 to 5/5 and hip flexion from a 4-/5 to a 4/5. Pt also states he has no BLE muscle weakness or fatigue completing his gardening tasks, but that is just takes longer to perform, thus meeting his gardening goal. Pt educated on recent literature and management with knee OA. Pt's last session will be used to formulate a progressive home exercise program for pt.    Stability/Clinical Decision Making Evolving/Moderate complexity    Clinical Decision Making Moderate    Rehab Potential Good    PT Frequency 2x / week    PT Duration 4 weeks    PT Treatment/Interventions ADLs/Self Care Home Management;Cryotherapy;Moist Heat;Gait training;Stair training;Functional mobility training;Therapeutic activities;Therapeutic exercise;Balance training;Patient/family education;Neuromuscular re-education;Manual techniques;Passive range of motion    PT Next Visit Plan Work on dual tasking next tx.  Single leg strengthening (R side).  RECERT after 7/1 and probable discharge next visit.    PT Home Exercise Plan [email or web address]    Consulted and Agree with Plan of Care Patient           Patient will benefit from skilled therapeutic intervention in order to improve the following deficits and impairments:  Postural dysfunction, Decreased mobility, Decreased activity tolerance, Decreased range of motion, Decreased strength, Hypomobility, Impaired flexibility, Decreased balance, Difficulty walking  Visit Diagnosis: Muscle weakness (generalized)  Gait difficulty     Problem List There are no problems to display for this patient.  Pura Spice, PT, DPT # [id number], SPT 10/31/2019, 2:50 PM  [place] [facility] [address] [place], [place], [address] Phone: [phone]   Fax:   [phone]  Name: Ross Odonnell MRN: [id number] Date of Birth: Sep 24, 1930

## 2019-11-02 ENCOUNTER — Ambulatory Visit: Payer: Medicare Other | Admitting: Physical Therapy

## 2019-11-06 ENCOUNTER — Encounter: Payer: Self-pay | Admitting: Physical Therapy

## 2019-11-06 ENCOUNTER — Other Ambulatory Visit: Payer: Self-pay

## 2019-11-06 ENCOUNTER — Ambulatory Visit: Payer: Medicare Other | Attending: Family Medicine | Admitting: Physical Therapy

## 2019-11-06 DIAGNOSIS — R269 Unspecified abnormalities of gait and mobility: Secondary | ICD-10-CM | POA: Diagnosis present

## 2019-11-06 DIAGNOSIS — M6281 Muscle weakness (generalized): Secondary | ICD-10-CM | POA: Diagnosis not present

## 2019-11-06 NOTE — Patient Instructions (Signed)
Access Code: [email or web address]: [email or web address]: 07/06/2021Prepared by: Casimiro Needle SherkExercises  Mini Lunge - 1 x daily - 4 x weekly - 1 sets - 20 reps  Squat with Chair Touch - 1 x daily - 4 x weekly - 1 sets - 20 reps  Standing Marching - 1 x daily - 4 x weekly - 1 sets - 20 reps  Standing Hip Extension with Counter Support - 1 x daily - 4 x weekly - 1 sets - 20 reps  Standing Hip Abduction with Counter Support - 1 x daily - 4 x weekly - 1 sets - 20 reps  Standing Heel Raises - 1 x daily - 4 x weekly - 1 sets - 20 reps  Tandem Walking with Counter Support - 1 x daily - 4 x weekly - 1 sets - 3 reps - 30 seconds hold  Supine Bridge - 1 x daily - 4 x weekly - 1 sets - 10 reps  Dead Bug - 1 x daily - 4 x weekly - 1 sets - 10 reps

## 2019-11-06 NOTE — Therapy (Signed)
[facility] [address] [place], [place], [address] Phone: [phone]   Fax:  [phone]  Physical Therapy Treatment/Discharge  Patient Details  Name: KIRT CHEW MRN: [id number] Date of Birth: 1931/02/19 Referring Provider (PT): Dr. Foye Deer   Encounter Date: 11/06/2019   PT End of Session - 11/06/19 1051    Visit Number 9    Number of Visits 9    Date for PT Re-Evaluation 11/06/19    Authorization - Visit Number 1    Authorization - Number of Visits 10    PT Start Time 0900    PT Stop Time 0947    PT Time Calculation (min) 47 min    Activity Tolerance Patient tolerated treatment well    Behavior During Therapy [facility] for tasks assessed/performed           Past Medical History:  Diagnosis Date  . Arthritis   . Diabetes mellitus without complication ([place])   . Renal disorder     Past Surgical History:  Procedure Laterality Date  . HERNIA REPAIR    . LIVER CYST REMOVAL    . ROTATOR CUFF REPAIR      There were no vitals filed for this visit.   Subjective Assessment - 11/06/19 1049    Subjective Pt believes he is ready for D/C today from PT. continuing to be compliant with HEP and no c/o R or L knee pain.    Pertinent History Pt. lives at home with wife.  Pt. is retired and used to operate a Furniture conservator/restorer.  Pt. enjoys gardening.  Pt. drives.    Limitations Standing;Walking;House hold activities    Patient Stated Goals Increase B LE muscle strength.      Currently in Pain? No/denies          There.ex:   Nu-Step L6 for 10 min use of UE/LE's  Pt educated on exercises and performed one set of every exercise listed below and given packet as HEP:   STS: 1x12, 5 lbs DB. Educated on progressions to holding two, 5 lbs DB's B Standing lunges at // bars with single UE support: 1x12/leg Calf raises: 1x20/leg. Educated on calf raise progression 3 way hip at // bars: flexion/abduction/extension w/ 5 lbs ankle weights  x12/direction. Min verbal cues for upright posture with improved carryover after cuing.   Single leg stance // bars: Bilateral for 30 sec/leg. Educated on progressing to 1 min/leg if 30 sec becomes easy and without use of UE's on counter at home.  Tandem stance forwards/backwards: x5/direction with no UE use.      PT Education - 11/06/19 1050    Education Details Pt educated on progressive HEP exercises for BLE strength maintenance since being D/C from PT today.    Person(s) Educated Patient    Methods Explanation;Demonstration;Handout    Comprehension Verbalized understanding;Returned demonstration               PT Long Term Goals - 11/06/19 1055      PT LONG TERM GOAL #1   Title Pt. will be independent with HEP to increase B hip/knee MMT 1/2 muscle grade to improve daily mobility with household/ gardening tasks.    Baseline Manual muscle testing: B quads 4+/5 MMT (R knee crepitus noted), B hamstrings 5/5 MMT, B hip adduction/ abduction 4+/5 MMT, B hip flexion 4-/5 MMT.; 10/31/2019 B hip flexion 4/5 and B quads 5/5    Time 4    Period Weeks    Status  Achieved    Target Date 11/06/19      PT LONG TERM GOAL #2   Title Pt. will increase FOTO to 59 to improve daily functional mobility.    Baseline 40; 10/31/2019 67    Time 4    Period Weeks    Status Achieved    Target Date 10/31/19      PT LONG TERM GOAL #3   Title Pt. able to complete all aspects of gardening/ yardwork with no c/o B LE muscle weakness/ fatigue.      Baseline c/o difficulty with R>L LE with walking.  Pt. states legs give out/ fatigued.; 10/31/2019 no difficulty with walking and working in garden    Time 4    Period Weeks    Status Achieved    Target Date 10/31/19              Plan - 11/06/19 1051    Clinical Impression Statement Pt has met all goals for therapy. Pt has improved FOTO score, is able to independently perform all gardening tasks safely that require BLE strength, and has improved all MMT  strength in BLE's to 5/5, with R hip flexion at 4+/5. Pt educated on progressive resitance HEP and dynamic balance tasks pt can perform at home to maintain BLE strength and balance so pt remains independent with ADL's. PT dislpays minor RLE weakness compared to LLE but is able to perform all tasks safely and independently without LOB. Pt given business card and number for any questions pt may have in the future with HEP or PT related needs.Pt satisfied with progress made in therapy and has no further questions about HEP given.    Stability/Clinical Decision Making Evolving/Moderate complexity    Rehab Potential Good    PT Frequency 2x / week    PT Duration 4 weeks    PT Treatment/Interventions ADLs/Self Care Home Management;Cryotherapy;Moist Heat;Gait training;Stair training;Functional mobility training;Therapeutic activities;Therapeutic exercise;Balance training;Patient/family education;Neuromuscular re-education;Manual techniques;Passive range of motion    PT Next Visit Plan D/c'd today from therapy.    PT Home Exercise Plan [email or web address]    Consulted and Agree with Plan of Care Patient           Patient will benefit from skilled therapeutic intervention in order to improve the following deficits and impairments:  Postural dysfunction, Decreased mobility, Decreased activity tolerance, Decreased range of motion, Decreased strength, Hypomobility, Impaired flexibility, Decreased balance, Difficulty walking  Visit Diagnosis: Muscle weakness (generalized)  Gait difficulty     Problem List There are no problems to display for this patient.  Pura Spice, PT, DPT # [id number] Larna Daughters, SPT 11/06/2019, 12:54 PM  [facility] [address] [place], [place], [address] Phone: [phone]   Fax:  [phone]  Name: OSBORN PULLIN MRN: [id number] Date of Birth: November 30, 1930

## 2020-10-23 ENCOUNTER — Ambulatory Visit: Payer: Medicare Other | Admitting: Physical Therapy

## 2020-10-27 ENCOUNTER — Other Ambulatory Visit: Payer: Self-pay

## 2020-10-27 ENCOUNTER — Ambulatory Visit: Payer: Medicare Other | Attending: Physical Medicine and Rehabilitation

## 2020-10-27 ENCOUNTER — Ambulatory Visit: Payer: Medicare Other | Admitting: Physical Therapy

## 2020-10-27 ENCOUNTER — Encounter: Payer: Self-pay | Admitting: Physical Therapy

## 2020-10-27 DIAGNOSIS — R269 Unspecified abnormalities of gait and mobility: Secondary | ICD-10-CM | POA: Insufficient documentation

## 2020-10-27 DIAGNOSIS — R262 Difficulty in walking, not elsewhere classified: Secondary | ICD-10-CM | POA: Diagnosis present

## 2020-10-27 DIAGNOSIS — M6281 Muscle weakness (generalized): Secondary | ICD-10-CM | POA: Insufficient documentation

## 2020-10-27 DIAGNOSIS — R2681 Unsteadiness on feet: Secondary | ICD-10-CM | POA: Diagnosis present

## 2020-10-27 NOTE — Therapy (Signed)
[facility] [address] [place], [place], [address] Phone: [phone]   Fax:  [phone]  Physical Therapy Treatment  Patient Details  Name: Ross Odonnell MRN: [id number] Date of Birth: 15-Apr-1931 Referring Provider (PT): Valeda Malm, MD   Encounter Date: 10/27/2020    Past Medical History:  Diagnosis Date   Arthritis    Diabetes mellitus without complication ([facility])    Renal disorder     Past Surgical History:  Procedure Laterality Date   HERNIA REPAIR     LIVER CYST REMOVAL     ROTATOR CUFF REPAIR      There were no vitals filed for this visit.   Subjective Assessment - 10/27/20 1442     Subjective Pt reports weakness in both legs, especially in the knees (R>L). Pt reports that he has been told that he has arthritis, that is causing his current functional deficits. However, he is frustrated with this dx because he has no pain. Pt has stated that the weakness has progress over last year. Examples provided were, generating power up during stair climbing and walking longer distances.    Pertinent History Pt is currently independent in and out of home, however, is having more difficulty completing tasks. Pt enjoys wood working and being outside.    Limitations Standing;Walking;House hold activities;Lifting    How long can you sit comfortably? Defer to later tx    How long can you stand comfortably? Defer to later tx.    How long can you walk comfortably? 5-10 mins    Diagnostic tests EMG scheduled 12/02/20    Patient Stated Goals Pt would like to gain a better understanding of his current medical condition. Pt would like to start riding a tricycle outside for personal fitness.    Currently in Pain? No/denies    Multiple Pain Sites No                [facility] PT Assessment - 10/27/20 0001       Assessment   Medical Diagnosis B LE weakness    Referring Provider (PT) Valeda Malm, MD    Onset  Date/Surgical Date 05/03/20    Next MD Visit 12/02/20      Balance Screen   Has the patient fallen in the past 6 months No    Has the patient had a decrease in activity level because of a fear of falling?  No    Is the patient reluctant to leave their home because of a fear of falling?  No      Home Tourist information centre manager residence      Prior Function   Level of Independence Independent      Cognition   Overall Cognitive Status Within Functional Limits for tasks assessed      Observation/Other Assessments   Focus on Therapeutic Outcomes (FOTO)  51      Functional Tests   Functional tests Single leg stance      Single Leg Stance   Comments R:30 sec L:18   trendelenburg + bilat     Deep Tendon Reflexes   DTR Assessment Site Patella    Patella DTR 1+      ROM / Strength   AROM / PROM / Strength AROM;PROM;Strength      AROM   Overall AROM  Deficits    AROM Assessment Site Knee;Shoulder;Hip    Right/Left Shoulder Right   Reassess at later tx.   Right/Left Knee Right;Left  Right Knee Extension 10    Right Knee Flexion 110    Left Knee Extension 0    Left Knee Flexion 125      PROM   Overall PROM  Deficits    PROM Assessment Site Knee    Right/Left Knee Right;Left    Right Knee Extension 5    Right Knee Flexion 120    Left Knee Extension 0    Left Knee Flexion 130      Strength   Strength Assessment Site Knee;Hip;Ankle    Right/Left Hip Right;Left    Right Hip Flexion 4/5    Right Hip ABduction 5/5    Right Hip ADduction 5/5    Left Hip Flexion 4/5    Left Hip ABduction 5/5    Left Hip ADduction 5/5    Right/Left Knee Left;Right    Right Knee Flexion 5/5    Right Knee Extension 4/5    Left Knee Flexion 5/5    Left Knee Extension 5/5    Right/Left Ankle Right;Left    Right Ankle Dorsiflexion 5/5    Left Ankle Dorsiflexion 5/5      Palpation   Patella mobility WNL    Palpation comment Mild bilat knee joint line tenderness       Transfers   Five time sit to stand comments  17sec   hands free           (-) bilat Clonus,(-)  Hoffmans, (-) Babinski,   Gait Assessment: Decrease stride length, decrease single leg stance time, Trendelenburg bilat (R>L), Pt amb with SPC in L hand with step through gait pattern and cane sequence with RLE.          PT Short Term Goals - 10/27/20 1542       PT SHORT TERM GOAL #1   Title Pt will undergo 6-minute walk test to assess functional endurance capacity.    Baseline n/a    Time 2    Period Weeks    Status New    Target Date 11/10/20               PT Long Term Goals - 10/27/20 1531       PT LONG TERM GOAL #1   Title Pt will increase FOTO score to 59 to improve pt reported functional capacity for greater ADL tolerance    Baseline 6/37: 51    Time 6    Period Weeks    Status New    Target Date 12/08/20      PT LONG TERM GOAL #2   Title Pt will improve all LE MMT to 5/5 without pain to maximize independence and safety in ADL.    Baseline 6/27:4/5 bilat hip, 4/5 R knee ext (limted by 4/10 pain)    Period Weeks    Status New    Target Date 12/08/20      PT LONG TERM GOAL #3   Title Pt will complete 5xSTS in <12 secs to improve BLE power for improved independence in AMB stairs.    Baseline 6/27: 17 sec (hands-free)    Time 6    Period Weeks    Status New    Target Date 12/08/20                Plan - 10/27/20 1527     Clinical Impression Statement Patient is a 87 year old man presenting to clinic with chief complaints of progressing bilateral LE weakness. Upon examination, patient demonstrates deficits in  static/dynamic posture, 5x STS 17 secs, Decrease stride length during gait, bilat knee ROM deficits (R>L), Pain with R knee flexion (4/10), (-) UQNS (-)LQNS (-) Hoffmans bilat (-)Babinski bilat (-) clonus bilat, (hyporeflexive:1) bilat patellar reflex. Patient's responses on FOTO outcome measures (51). Pt will benefit from skilled PT treatment 2x  weeks for 6 weeks to target functional LE strength, endurance, and balance deficits. Pt displayed good prognosis to address functional deficits with skilled PT treatment and specific HEP.    Personal Factors and Comorbidities Age;Time since onset of injury/illness/exacerbation    Examination-Activity Limitations Stairs;Squat;Transfers;Toileting    Stability/Clinical Decision Making Evolving/Moderate complexity    Clinical Decision Making Moderate    Rehab Potential Good    PT Frequency 2x / week    PT Duration 6 weeks    PT Treatment/Interventions ADLs/Self Care Home Management;Cryotherapy;Moist Heat;Gait training;Stair training;Functional mobility training;Therapeutic activities;Therapeutic exercise;Balance training;Patient/family education;Neuromuscular re-education;Manual techniques;Passive range of motion;Biofeedback;Electrical Stimulation;Traction;Dry needling;Energy conservation;Joint Manipulations;Spinal Manipulations    PT Next Visit Plan Reassess adherence to HEP. Assess 6 min walk test and coordination.    PT Home Exercise Plan [email or web address]    Consulted and Agree with Plan of Care Patient;Family member/caregiver    Family Member Consulted Daughter via phone call.                  Patient will benefit from skilled therapeutic intervention in order to improve the following deficits and impairments:     Visit Diagnosis: Muscle weakness (generalized)  Difficulty in walking, not elsewhere classified  Unsteadiness on feet     Problem List There are no problems to display for this patient.   Lawernce Ion SPT 10/27/2020, 3:04 PM  [facility] [address] [place], [place], [address] Phone: [phone]   Fax:  [phone]  Name: RUBE SANCHEZ MRN: [id number] Date of Birth: 03-19-1931

## 2020-10-27 NOTE — Addendum Note (Signed)
Addended by: Rosamaria Lints on: 10/27/2020 05:31 PM   Modules accepted: Orders

## 2020-10-29 ENCOUNTER — Ambulatory Visit: Payer: Medicare Other

## 2020-10-29 ENCOUNTER — Other Ambulatory Visit: Payer: Self-pay

## 2020-10-29 VITALS — HR 85

## 2020-10-29 DIAGNOSIS — R269 Unspecified abnormalities of gait and mobility: Secondary | ICD-10-CM

## 2020-10-29 DIAGNOSIS — M6281 Muscle weakness (generalized): Secondary | ICD-10-CM | POA: Diagnosis not present

## 2020-10-29 DIAGNOSIS — R262 Difficulty in walking, not elsewhere classified: Secondary | ICD-10-CM

## 2020-10-29 DIAGNOSIS — R2681 Unsteadiness on feet: Secondary | ICD-10-CM

## 2020-10-29 NOTE — Therapy (Signed)
[facility] [facility] [address] [place], [place], [address] Phone: [phone]   Fax:  [phone]  Physical Therapy Treatment  Patient Details  Name: Ross Odonnell MRN: [id number] Date of Birth: [birth year] Referring Provider (PT): Valeda Malm, MD   Encounter Date: 10/29/2020   PT End of Session - 10/29/20 1421     Visit Number 2    Number of Visits 12    Date for PT Re-Evaluation 12/08/20    Authorization Type Medicare A&B primary; generic commercial secondary    Authorization Time Period 10/27/20-12/08/20    Authorization - Visit Number 2    Authorization - Number of Visits 10    Progress Note Due on Visit 12    PT Start Time 1300    PT Stop Time 1345    PT Time Calculation (min) 45 min    Activity Tolerance Patient tolerated treatment well;Patient limited by fatigue    Behavior During Therapy [facility] for tasks assessed/performed             Past Medical History:  Diagnosis Date   Arthritis    Diabetes mellitus without complication ([facility])    Renal disorder     Past Surgical History:  Procedure Laterality Date   HERNIA REPAIR     LIVER CYST REMOVAL     ROTATOR CUFF REPAIR      Vitals:   10/29/20 1418  Pulse: 85  SpO2: 95%     Subjective Assessment - 10/29/20 1419     Subjective Pt reports no increase in pain/soreness form last tx. Pt reports good adherence to HEP with no issues to report.    Pertinent History Pt is currently independent in and out of home, however, is having more difficulty completing tasks. Pt enjoys wood working and being outside.    Limitations Standing;Walking;House hold activities;Lifting    How long can you sit comfortably? Defer to later tx    How long can you stand comfortably? Defer to later tx.    How long can you walk comfortably? 5-10 mins    Diagnostic tests EMG scheduled 12/02/20    Patient Stated Goals Pt would like to gain a better understanding of his current medical  condition. Pt would like to start riding a tricycle outside for personal fitness.    Currently in Pain? No/denies             Therapeutic Exercise:  6 minute walk test: 819ft SpO2: 93% HR 95  NuStep 10 mins: L4: Discussed Hep adherence SpO2: 95%  HR:80  distance: 0.43. Not billed.  // bars with touch-mild UE assist during the following therex  Repeated Step ups 2x5 each leg. Pt displayed increased difficulty with R UE compared to L. Pt stated that is fearful his leg giving out. Verbal cueing required for proper triple ext at the top of the step.    Ankle weights 4# bilat:  Seated LAQ x20 each side   Lateral stepping x4 // bar lengths  Hip driving x6 // bar lengths   Standing hamstring curls x10 bilat   Heel raise x 20    PT Education - 10/29/20 1420     Education Details Pt educated on delayed onset muscle soreness monitoring for proper tissue loading next tx    Person(s) Educated Patient    Methods Explanation    Comprehension Verbalized understanding              PT Short Term Goals - 10/29/20 1422  PT SHORT TERM GOAL #1   Title Pt will undergo 6-minute walk test to assess functional endurance capacity.    Baseline 896 ft 6/29    Time 2    Period Weeks    Status Achieved    Target Date 11/10/20               PT Long Term Goals - 10/29/20 1423       PT LONG TERM GOAL #1   Title Pt will increase FOTO score to 59 to improve pt reported functional capacity for greater ADL tolerance    Baseline 2/83: 51    Time 6    Period Weeks    Status New      PT LONG TERM GOAL #2   Title Pt will improve all LE MMT to 5/5 without pain to maximize independence and safety in ADL.    Baseline 6/27:4/5 bilat hip, 4/5 R knee ext (limted by 4/10 pain)    Period Weeks    Status New      PT LONG TERM GOAL #3   Title Pt will complete 5xSTS in <12 secs to improve BLE power for improved independence in AMB stairs.    Baseline 6/27: 17 sec (hands-free)    Time 6     Period Weeks    Status New      PT LONG TERM GOAL #4   Title Pt will be able to walk 1300 ft during a 6 minute walk test with a SPC for safe return functional amb in the community    Baseline 896    Time 6    Period Weeks    Status New    Target Date 12/08/20                   Plan - 10/29/20 1422     Clinical Impression Statement Pt completed 842ft during a 6 minute walk test, which is less than ideal community distance of ~1300 feet. Pt stated that he could have gone farther with more time. 6 Min walk test goal was added to POC. Pt Displays mild antalgic-like-gait, however, pt does not report pain. Pt's cane would benefit from a fitting and should be addressed next treatment. Pt. will continue to benefit from skilled physical therapy to progress POC to address remaining deficits to facilitate maximum functional capacity for optimal personal health and wellness for ADLs.    Personal Factors and Comorbidities Age;Time since onset of injury/illness/exacerbation    Examination-Activity Limitations Stairs;Squat;Transfers;Toileting    Stability/Clinical Decision Making Evolving/Moderate complexity    Clinical Decision Making Moderate    Rehab Potential Good    PT Frequency 2x / week    PT Duration 6 weeks    PT Treatment/Interventions ADLs/Self Care Home Management;Cryotherapy;Moist Heat;Gait training;Stair training;Functional mobility training;Therapeutic activities;Therapeutic exercise;Balance training;Patient/family education;Neuromuscular re-education;Manual techniques;Passive range of motion;Biofeedback;Electrical Stimulation;Traction;Dry needling;Energy conservation;Joint Manipulations;Spinal Manipulations    PT Next Visit Plan Fit [facility] for pt height. Increase power focus next tx.    PT Home Exercise Plan [email or web address]    Consulted and Agree with Plan of Care Patient;Family member/caregiver    Family Member Consulted Daughter via phone call.             Patient will benefit  from skilled therapeutic intervention in order to improve the following deficits and impairments:  Postural dysfunction, Decreased mobility, Decreased activity tolerance, Decreased range of motion, Decreased strength, Hypomobility, Impaired flexibility, Decreased balance, Difficulty walking, Decreased endurance, Improper body mechanics, Decreased coordination, Decreased  safety awareness  Visit Diagnosis: Muscle weakness (generalized)  Difficulty in walking, not elsewhere classified  Unsteadiness on feet  Gait difficulty     Problem List There are no problems to display for this patient.   Asa Saunas Claudia Alvizo SPT   [place]. [place]ly IV, PT, DPT Physical Therapist- [place]  [facility]  10/29/2020, 2:39 PM  [place] [facility] [address] [place], [place], [address] Phone: [phone]   Fax:  [phone]  Name: Ross Odonnell MRN: [id number] Date of Birth: 07-23-1930

## 2020-11-05 ENCOUNTER — Ambulatory Visit: Payer: Medicare Other | Attending: Physical Medicine and Rehabilitation

## 2020-11-05 ENCOUNTER — Other Ambulatory Visit: Payer: Self-pay

## 2020-11-05 DIAGNOSIS — R2681 Unsteadiness on feet: Secondary | ICD-10-CM | POA: Diagnosis present

## 2020-11-05 DIAGNOSIS — R262 Difficulty in walking, not elsewhere classified: Secondary | ICD-10-CM | POA: Diagnosis present

## 2020-11-05 DIAGNOSIS — R269 Unspecified abnormalities of gait and mobility: Secondary | ICD-10-CM | POA: Diagnosis present

## 2020-11-05 DIAGNOSIS — M6281 Muscle weakness (generalized): Secondary | ICD-10-CM | POA: Diagnosis present

## 2020-11-05 NOTE — Therapy (Signed)
[facility] [address] [place], [place], [address] Phone: [phone]   Fax:  [phone]  Physical Therapy Treatment  Patient Details  Name: Ross Odonnell MRN: [id number] Date of Birth: January 24, 1931 Referring Provider (PT): Valeda Malm, MD   Encounter Date: 11/05/2020   PT End of Session - 11/05/20 1310     Visit Number 3    Number of Visits 12    Date for PT Re-Evaluation 12/08/20    Authorization Type Medicare A&B primary; generic commercial secondary    Authorization Time Period 10/27/20-12/08/20    Authorization - Visit Number 3    Authorization - Number of Visits 10    Progress Note Due on Visit 12    PT Start Time 1300    PT Stop Time 1347    PT Time Calculation (min) 47 min    Equipment Utilized During Treatment Gait belt    Activity Tolerance Patient tolerated treatment well;Patient limited by fatigue    Behavior During Therapy WFL for tasks assessed/performed             Past Medical History:  Diagnosis Date   Arthritis    Diabetes mellitus without complication (HCC)    Renal disorder     Past Surgical History:  Procedure Laterality Date   HERNIA REPAIR     LIVER CYST REMOVAL     ROTATOR CUFF REPAIR      Vitals:   11/05/20 1423  SpO2: (!) 88%     Subjective Assessment - 11/05/20 1302     Subjective Pt presents to tx without complaints of pain. Pt stated that he had min-mod muscle soreness after last tx lasting 48 hours. Increase in soreness did not affect is functional status around the home or community. However, pt did say he did less 2/2 to the hot temp.    Pertinent History Pt is currently independent in and out of home, however, is having more difficulty completing tasks. Pt enjoys wood working and being outside.    Limitations Standing;Walking;House hold activities;Lifting    How long can you sit comfortably? WNL    How long can you stand comfortably? WNL    How long can you walk  comfortably? 5-10 mins    Diagnostic tests EMG scheduled 12/02/20    Patient Stated Goals Pt would like to gain a better understanding of his current medical condition. Pt would like to start riding a tricycle outside for personal fitness.    Currently in Pain? No/denies    Multiple Pain Sites No              Therapeutic Exercise:  Nustep 10 minutes at level 5.  Sp02 88% HR 85 at minute 5 mark SpO2% 88 HR 92 at minute 8 mark  SpO2% 88 HR 91 at minute 10 mark   *pt required 1-2 minute break to regain SpO2 98%*  // Bars to ensure proper LE endurance/ power loading with minor UE assist safety with reduce fall risk: Step up with drive 5x2 each side: TT01% =88% upon rest.   *pt required 1-2 minute break to regain SpO2 98%*   Lateral Step up 5x2 each side: Sp02% =88% upon rest.   *pt required 1-2 minute break to regain SpO2 98%*  Amb 1105ft x4 SBA with verbal cueing for proper breathing technique  Sp02% =88% upon rest.   *pt required 1-2 minute break between sets to regain SpO2 98%*  4 forward step climb with bilat hand  rails with backward step return 6x4. SpO2% = 88% upon rest from the 6th rep. Verbal cueing for proper breathing technique.    *pt required 1-2 minute break between sets to regain SpO2 98%*          PT Education - 11/05/20 1306     Education Details Pt was educated on correct technique and form for LE power production. Red flag signs that warrents contacting PCP. Purse lip breathing for increase SpO2 readings after exercise bouts.    Person(s) Educated Patient    Methods Explanation    Comprehension Verbalized understanding              PT Short Term Goals - 10/29/20 1422       PT SHORT TERM GOAL #1   Title Pt will undergo 6-minute walk test to assess functional endurance capacity.    Baseline 896 ft 6/29    Time 2    Period Weeks    Status Achieved    Target Date 11/10/20               PT Long Term Goals - 10/29/20 1423       PT  LONG TERM GOAL #1   Title Pt will increase FOTO score to 59 to improve pt reported functional capacity for greater ADL tolerance    Baseline 6/27: 51    Time 6    Period Weeks    Status New      PT LONG TERM GOAL #2   Title Pt will improve all LE MMT to 5/5 without pain to maximize independence and safety in ADL.    Baseline 6/27:4/5 bilat hip, 4/5 R knee ext (limted by 4/10 pain)    Period Weeks    Status New      PT LONG TERM GOAL #3   Title Pt will complete 5xSTS in <12 secs to improve BLE power for improved independence in AMB stairs.    Baseline 6/27: 17 sec (hands-free)    Time 6    Period Weeks    Status New      PT LONG TERM GOAL #4   Title Pt will be able to walk 1300 ft during a 6 minute walk test with a SPC for safe return functional amb in the community    Baseline 896    Time 6    Period Weeks    Status New    Target Date 12/08/20                   Plan - 11/05/20 1401     Clinical Impression Statement Pt tolerated decreased activity tolerance 2/2 poor SpO2 readings with prolong exercise. Digital pulse oximetry was read on two separate units to ensure  correct readings. Pt denied shortness of breath, chest pain, or feeling light head/syncopal. Pt was educated on purse lip breathing which resulting in increased SpO2 readings during rest breaks. Pt was educated on red flag signs mentioned above as well as other respiratory infection signs that would warrant contacting his PCP. Pt. will continue to benefit from skilled physical therapy to progress POC to address remaining deficits to facilitate maximum functional capacity for optimal personal health and wellness for ADLs.    Personal Factors and Comorbidities Age;Time since onset of injury/illness/exacerbation    Examination-Activity Limitations Stairs;Squat;Transfers;Toileting    Stability/Clinical Decision Making Evolving/Moderate complexity    Clinical Decision Making Moderate    Rehab Potential Good    PT  Frequency 2x /  week    PT Duration 6 weeks    PT Treatment/Interventions ADLs/Self Care Home Management;Cryotherapy;Moist Heat;Gait training;Stair training;Functional mobility training;Therapeutic activities;Therapeutic exercise;Balance training;Patient/family education;Neuromuscular re-education;Manual techniques;Passive range of motion;Biofeedback;Electrical Stimulation;Traction;Dry needling;Energy conservation;Joint Manipulations;Spinal Manipulations    PT Next Visit Plan Reassess oxygen saturation with exercise.    PT Home Exercise Plan [id number]    Consulted and Agree with Plan of Care Patient             Patient will benefit from skilled therapeutic intervention in order to improve the following deficits and impairments:  Postural dysfunction, Decreased mobility, Decreased activity tolerance, Decreased range of motion, Decreased strength, Hypomobility, Impaired flexibility, Decreased balance, Difficulty walking, Decreased endurance, Improper body mechanics, Decreased coordination, Decreased safety awareness  Visit Diagnosis: Muscle weakness (generalized)  Unsteadiness on feet  Difficulty in walking, not elsewhere classified  Gait difficulty     Problem List There are no problems to display for this patient.   Asa Saunas Verna Desrocher SPT  [place]. Fairly IV, PT, DPT Physical Therapist- [place]  [facility]  11/05/2020, 2:35 PM  [facility] [address] [place], [place], [address] Phone: [phone]   Fax:  [phone]  Name: Ross Odonnell MRN: [id number] Date of Birth: 04-18-1931

## 2020-11-10 ENCOUNTER — Ambulatory Visit: Payer: Medicare Other | Admitting: Physical Therapy

## 2020-11-10 ENCOUNTER — Encounter: Payer: Self-pay | Admitting: Physical Therapy

## 2020-11-10 ENCOUNTER — Other Ambulatory Visit: Payer: Self-pay

## 2020-11-10 DIAGNOSIS — M6281 Muscle weakness (generalized): Secondary | ICD-10-CM

## 2020-11-10 DIAGNOSIS — R262 Difficulty in walking, not elsewhere classified: Secondary | ICD-10-CM

## 2020-11-10 DIAGNOSIS — R2681 Unsteadiness on feet: Secondary | ICD-10-CM

## 2020-11-10 DIAGNOSIS — R269 Unspecified abnormalities of gait and mobility: Secondary | ICD-10-CM

## 2020-11-10 NOTE — Therapy (Signed)
[place] [facility] [address] [place], [place], [address] Phone: [phone]   Fax:  [phone]  Physical Therapy Treatment  Patient Details  Name: Ross Odonnell MRN: [id number] Date of Birth: December 24, 1930 Referring Provider (PT): Valeda Malm, MD   Encounter Date: 11/10/2020   PT End of Session - 11/10/20 1411     Visit Number 4    Number of Visits 12    Date for PT Re-Evaluation 12/08/20    Authorization Type Medicare A&B primary; generic commercial secondary    Authorization Time Period 10/27/20-12/08/20    Authorization - Visit Number 4    Authorization - Number of Visits 10    Progress Note Due on Visit 12    PT Start Time 1258    PT Stop Time 1347    PT Time Calculation (min) 49 min    Equipment Utilized During Treatment Gait belt    Activity Tolerance Patient tolerated treatment well;Patient limited by fatigue    Behavior During Therapy WFL for tasks assessed/performed             Past Medical History:  Diagnosis Date   Arthritis    Diabetes mellitus without complication (HCC)    Renal disorder     Past Surgical History:  Procedure Laterality Date   HERNIA REPAIR     LIVER CYST REMOVAL     ROTATOR CUFF REPAIR      There were no vitals filed for this visit.   Subjective Assessment - 11/10/20 1407     Subjective Pt presents to tx without complaints of pain. Pt started that has noticed increased fatigue compared to baseline. However, has noted and decrease in severity. Pt was able to visit with his daughter without functional complaints. Pt had a visit with Dr. Dolores Lory, MD today and was presribed Voltaren for sx management.    Pertinent History Pt is currently independent in and out of home, however, is having more difficulty completing tasks. Pt enjoys wood working and being outside.    Limitations Standing;Walking;House hold activities;Lifting    How long can you sit comfortably? WNL    How long can you  stand comfortably? WNL    How long can you walk comfortably? 5-10 mins    Diagnostic tests EMG scheduled 12/02/20    Patient Stated Goals Pt would like to gain a better understanding of his current medical condition. Pt would like to start riding a tricycle outside for personal fitness.    Currently in Pain? No/denies    Pain Score 0-No pain                  Therapeutic Exercise:   Nustep 10 minutes at level 5. Sp02 92% HR 73 at minute 3:30 mark  Sp02 91% HR 76 at minute 5 mark SpO2% 90% HR 78 at minute 8 mark  SpO2% 89% HR 80 at minute 10 mark   *pt required 1-2 minute break to regain SpO2 95%*   STS from blue mat table at a the lowest level that allowed STS without UE assist. 4x5 with verbal cueing to stand up quickly and sit down with a count of 3 secs. Pt reported 8/10 knee pain after 1st. Seated LAQ and hip marching with 4# ankle weights bilat reduced knee pain to 3/10 during the remaining 3 sets of STS. SpO2% maintain >88% during task.    Stair climbing 3x3 ascent/decent with 4 x ankle weights to facilitate resisted power training. Stair climbing  x6 without weights and verbal cueing to facilitate speed of movement. SpO2% maintain >88% during task.    *Pt required prolonged rest breaks to facilitate SpO2% of 88% during activity to ~94%.         PT Education - 11/10/20 1411     Education Details Pt educated on continued to monitor fatigue levels and proper breathing technique.    Person(s) Educated Patient    Methods Explanation    Comprehension Verbalized understanding;Returned demonstration              PT Short Term Goals - 10/29/20 1422       PT SHORT TERM GOAL #1   Title Pt will undergo 6-minute walk test to assess functional endurance capacity.    Baseline 896 ft 6/29    Time 2    Period Weeks    Status Achieved    Target Date 11/10/20               PT Long Term Goals - 10/29/20 1423       PT LONG TERM GOAL #1   Title Pt will  increase FOTO score to 59 to improve pt reported functional capacity for greater ADL tolerance    Baseline 2/77: 51    Time 6    Period Weeks    Status New      PT LONG TERM GOAL #2   Title Pt will improve all LE MMT to 5/5 without pain to maximize independence and safety in ADL.    Baseline 6/27:4/5 bilat hip, 4/5 R knee ext (limted by 4/10 pain)    Period Weeks    Status New      PT LONG TERM GOAL #3   Title Pt will complete 5xSTS in <12 secs to improve BLE power for improved independence in AMB stairs.    Baseline 6/27: 17 sec (hands-free)    Time 6    Period Weeks    Status New      PT LONG TERM GOAL #4   Title Pt will be able to walk 1300 ft during a 6 minute walk test with a SPC for safe return functional amb in the community    Baseline 896    Time 6    Period Weeks    Status New    Target Date 12/08/20                   Plan - 11/10/20 1414     Clinical Impression Statement Pt displayed increased activity tolerance compared to last tx, however, is still limited by poor oxygen saturation. Pt SpO2: 92% at rest before tx.  All tx was completed Spo2>/= 88%. However, SpO2 reading were often 88-90% at the end of exercise bouts and self-selected rest breaks. Spo2% returned to 95% after verbal cueing for pursed lip breathing. Pt. will continue to benefit from skilled physical therapy to progress POC to address remaining deficits to facilitate maximum functional capacity for optimal personal health and wellness for ADLs.    Personal Factors and Comorbidities Age;Time since onset of injury/illness/exacerbation    Examination-Activity Limitations Stairs;Squat;Transfers;Toileting    Stability/Clinical Decision Making Evolving/Moderate complexity    Clinical Decision Making Moderate    Rehab Potential Good    PT Frequency 2x / week    PT Duration 6 weeks    PT Treatment/Interventions ADLs/Self Care Home Management;Cryotherapy;Moist Heat;Gait training;Stair  training;Functional mobility training;Therapeutic activities;Therapeutic exercise;Balance training;Patient/family education;Neuromuscular re-education;Manual techniques;Passive range of motion;Biofeedback;Electrical Stimulation;Traction;Dry needling;Energy conservation;Joint Manipulations;Spinal Manipulations  PT Next Visit Plan Reassess oxygen saturation with exercise: Increase Power target exercise.    PT Home Exercise Plan [id number]    Consulted and Agree with Plan of Care Patient             Patient will benefit from skilled therapeutic intervention in order to improve the following deficits and impairments:  Postural dysfunction, Decreased mobility, Decreased activity tolerance, Decreased range of motion, Decreased strength, Hypomobility, Impaired flexibility, Decreased balance, Difficulty walking, Decreased endurance, Improper body mechanics, Decreased coordination, Decreased safety awareness  Visit Diagnosis: Muscle weakness (generalized)  Unsteadiness on feet  Difficulty in walking, not elsewhere classified  Gait difficulty     Problem List There are no problems to display for this patient.  Cammie Mcgee, PT, DPT # [id number] Lawernce Ion SPT 11/11/2020, 7:44 AM  [place] [facility] [address] [place], [place], [address] Phone: [phone]   Fax:  [phone]  Name: Ross Odonnell MRN: [id number] Date of Birth: 1930-07-29

## 2020-11-12 ENCOUNTER — Ambulatory Visit: Payer: Medicare Other | Admitting: Physical Therapy

## 2020-11-12 ENCOUNTER — Other Ambulatory Visit: Payer: Self-pay

## 2020-11-12 ENCOUNTER — Encounter: Payer: Self-pay | Admitting: Physical Therapy

## 2020-11-12 DIAGNOSIS — R269 Unspecified abnormalities of gait and mobility: Secondary | ICD-10-CM

## 2020-11-12 DIAGNOSIS — M6281 Muscle weakness (generalized): Secondary | ICD-10-CM

## 2020-11-12 DIAGNOSIS — R2681 Unsteadiness on feet: Secondary | ICD-10-CM

## 2020-11-12 NOTE — Therapy (Signed)
[place] [facility] [address]. [place], [place], [address] Phone: [phone]   Fax:  [phone]  Physical Therapy Treatment  Patient Details  Name: Ross Odonnell MRN: [id number] Date of Birth: [birth year] Referring Provider (PT): Valeda Malm, MD   Encounter Date: 11/12/2020   PT End of Session - 11/12/20 1401     Visit Number 5    Number of Visits 12    Date for PT Re-Evaluation 12/08/20    Authorization Type Medicare A&B primary; generic commercial secondary    Authorization Time Period 10/27/20-12/08/20    Authorization - Visit Number 5    Authorization - Number of Visits 10    Progress Note Due on Visit 12    PT Start Time 1256    PT Stop Time 1346    PT Time Calculation (min) 50 min    Equipment Utilized During Treatment Gait belt    Activity Tolerance Patient tolerated treatment well;Patient limited by fatigue    Behavior During Therapy WFL for tasks assessed/performed             Past Medical History:  Diagnosis Date   Arthritis    Diabetes mellitus without complication (HCC)    Renal disorder     Past Surgical History:  Procedure Laterality Date   HERNIA REPAIR     LIVER CYST REMOVAL     ROTATOR CUFF REPAIR      There were no vitals filed for this visit.   Subjective Assessment - 11/12/20 1259     Subjective Pt presents to tx without complaints of pain in the knee. Pt reported 6-7/10 soreness in the both calves after last tx. Pt presented to tx without SPC because he forgot it at home. Pt continues to report decreased energy levels, however they are getting better.    Pertinent History Pt is currently independent in and out of home, however, is having more difficulty completing tasks. Pt enjoys wood working and being outside.    Limitations Standing;Walking;House hold activities;Lifting    How long can you sit comfortably? WNL    How long can you stand comfortably? WNL    How long can you walk  comfortably? 5-10 mins    Diagnostic tests EMG scheduled 12/02/20    Patient Stated Goals Pt would like to gain a better understanding of his current medical condition. Pt would like to start riding a tricycle outside for personal fitness.    Currently in Pain? Yes    Pain Score 6     Pain Location Calf    Pain Orientation Right;Left    Pain Descriptors / Indicators Sore    Pain Type Other (Comment)   DOMS from last tx/   Pain Onset In the past 7 days               Therapeutic Exercise:   Nustep 10 minutes at level 3.  Sp02 94% HR 68 at minute 5 mark SpO2% 94% HR 71` at minute 7.30 mark  SpO2% 94% HR 68 at minute 10 mark  STS x10 with arms crossed from standard height chair. Verbal/contact cueing for proper from.     Seated LAQ and Hip marching x30 with 4# ankle weights to facilitate decrease tenderness/stiffness in bilat knees for great tolerance of standing/gait training./   //Bars: CGA with gait belt and verbal/contact cueing to ensure correct spinal/LE stabilization during activity. 4# bilat ankle weights for all activity:  6 in step up with drive 4x5  each side.   Heel tap from step to blue airex pad 2x10 each side.   Lateral stepping 2x4 laps    Gait training: 400 ft with bilat 4# ankles weights. 1 self selected seated break SpO2: 90 HR 80. Verbal cueing for facilitate proper step length/height. 300 ft without ankle weights no breaks needed SpO2% 95% at the end of walking bout.      *pt required prolonged rest breaks between set and exercises. SpO2 was maintained >90% SpO2 during all activity.*            PT Education - 11/12/20 1304     Education Details PT educated to continued to monitor fatigue level and proper breathing technique. Correct form and function during tx for maximal benefit of exercise benefit.    Person(s) Educated Patient    Methods Explanation    Comprehension Verbalized understanding;Returned demonstration              PT  Short Term Goals - 10/29/20 1422       PT SHORT TERM GOAL #1   Title Pt will undergo 6-minute walk test to assess functional endurance capacity.    Baseline 896 ft 6/29    Time 2    Period Weeks    Status Achieved    Target Date 11/10/20               PT Long Term Goals - 10/29/20 1423       PT LONG TERM GOAL #1   Title Pt will increase FOTO score to 59 to improve pt reported functional capacity for greater ADL tolerance    Baseline 1/61: 51    Time 6    Period Weeks    Status New      PT LONG TERM GOAL #2   Title Pt will improve all LE MMT to 5/5 without pain to maximize independence and safety in ADL.    Baseline 6/27:4/5 bilat hip, 4/5 R knee ext (limted by 4/10 pain)    Period Weeks    Status New      PT LONG TERM GOAL #3   Title Pt will complete 5xSTS in <12 secs to improve BLE power for improved independence in AMB stairs.    Baseline 6/27: 17 sec (hands-free)    Time 6    Period Weeks    Status New      PT LONG TERM GOAL #4   Title Pt will be able to walk 1300 ft during a 6 minute walk test with a SPC for safe return functional amb in the community    Baseline 896    Time 6    Period Weeks    Status New    Target Date 12/08/20                   Plan - 11/12/20 1402     Clinical Impression Statement Pt displayed greater endurance and oxygen saturation this tx compared to previous. Pt stated that he has noticed and slight improvement in his fatigue level every day from the onset last week. SpO2 reading were consistently higher this tx compared to last (2 days go) with SpO2 never dropping below 90%. Pt continued to display poor LE endurance and strength resulting in increase fall risk which warrant verbal/contact cueing with gait belt. Pt. will continue to benefit from skilled physical therapy to progress POC to address remaining deficits to facilitate maximum functional capacity for optimal personal health and wellness for ADLs.  Personal Factors  and Comorbidities Age;Time since onset of injury/illness/exacerbation    Examination-Activity Limitations Stairs;Squat;Transfers;Toileting    Stability/Clinical Decision Making Evolving/Moderate complexity    Clinical Decision Making Moderate    Rehab Potential Good    PT Frequency 2x / week    PT Duration 6 weeks    PT Treatment/Interventions ADLs/Self Care Home Management;Cryotherapy;Moist Heat;Gait training;Stair training;Functional mobility training;Therapeutic activities;Therapeutic exercise;Balance training;Patient/family education;Neuromuscular re-education;Manual techniques;Passive range of motion;Biofeedback;Electrical Stimulation;Traction;Dry needling;Energy conservation;Joint Manipulations;Spinal Manipulations    PT Next Visit Plan Reassess oxygen saturation with exercise: Increase Power target exercise.    PT Home Exercise Plan [id number]    Consulted and Agree with Plan of Care Patient             Patient will benefit from skilled therapeutic intervention in order to improve the following deficits and impairments:  Postural dysfunction, Decreased mobility, Decreased activity tolerance, Decreased range of motion, Decreased strength, Hypomobility, Impaired flexibility, Decreased balance, Difficulty walking, Decreased endurance, Improper body mechanics, Decreased coordination, Decreased safety awareness  Visit Diagnosis: Muscle weakness (generalized)  Unsteadiness on feet  Gait difficulty     Problem List There are no problems to display for this patient.  Cammie Mcgee, PT, DPT # [id number] Lawernce Ion SPT 11/12/2020, 2:40 PM  [facility] [address] [place], [place], [address] Phone: [phone]   Fax:  [phone]  Name: Ross Odonnell MRN: [id number] Date of Birth: October 16, 1930

## 2020-11-17 ENCOUNTER — Ambulatory Visit: Payer: Medicare Other | Admitting: Physical Therapy

## 2020-11-17 ENCOUNTER — Encounter: Payer: Self-pay | Admitting: Physical Therapy

## 2020-11-17 ENCOUNTER — Other Ambulatory Visit: Payer: Self-pay

## 2020-11-17 DIAGNOSIS — R2681 Unsteadiness on feet: Secondary | ICD-10-CM

## 2020-11-17 DIAGNOSIS — R262 Difficulty in walking, not elsewhere classified: Secondary | ICD-10-CM

## 2020-11-17 DIAGNOSIS — M6281 Muscle weakness (generalized): Secondary | ICD-10-CM | POA: Diagnosis not present

## 2020-11-17 DIAGNOSIS — R269 Unspecified abnormalities of gait and mobility: Secondary | ICD-10-CM

## 2020-11-17 NOTE — Therapy (Signed)
[facility] [address] [place], [place], [address] Phone: [phone]   Fax:  [phone]  Physical Therapy Treatment  Patient Details  Name: Ross Odonnell MRN: [id number] Date of Birth: Oct 02, 1930 Referring Provider (PT): Valeda Malm, MD   Encounter Date: 11/17/2020   PT End of Session - 11/17/20 1248     Visit Number 6    Number of Visits 12    Date for PT Re-Evaluation 12/08/20    Authorization Type Medicare A&B primary; generic commercial secondary    Authorization Time Period 10/27/20-12/08/20    Authorization - Visit Number 6    Authorization - Number of Visits 10    Progress Note Due on Visit 12    PT Start Time 1249    PT Stop Time 1338    PT Time Calculation (min) 49 min    Equipment Utilized During Treatment Gait belt    Activity Tolerance Patient tolerated treatment well;Patient limited by fatigue    Behavior During Therapy WFL for tasks assessed/performed             Past Medical History:  Diagnosis Date   Arthritis    Diabetes mellitus without complication (HCC)    Renal disorder     Past Surgical History:  Procedure Laterality Date   HERNIA REPAIR     LIVER CYST REMOVAL     ROTATOR CUFF REPAIR      There were no vitals filed for this visit.   Subjective Assessment - 11/17/20 1248     Subjective Pt presents to tx with mod bilat calf soreness, 6/10 . 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 Pt continues to report low energy.    Pertinent History Pt is currently independent in and out of home, however, is having more difficulty completing tasks. Pt enjoys wood working and being outside.    Limitations Standing;Walking;House hold activities;Lifting    How long can you sit comfortably? WNL    How long can you stand comfortably? WNL    How long can you walk comfortably? 5-10 mins    Diagnostic tests EMG scheduled 12/02/20    Patient  Stated Goals Pt would like to gain a better understanding of his current medical condition. Pt would like to start riding a tricycle outside for personal fitness.    Currently in Pain? Yes    Pain Score 6     Pain Location Calf    Pain Orientation Right;Left    Pain Descriptors / Indicators Sore    Pain Type Other (Comment)    Pain Onset In the past 7 days                Therapeutic Exercise:   Nustep 10 minutes at level 4.  Sp02 94% HR 68 at minute 5 mark RPE: 5-6/10 SpO2 94% HR 68 at minute 10 mark RPE 7-8/10  Stair climbing: 6x3 without ankle weights to facilitate increase endurance loading of the LE. SpO2 92% HR 80 RPE 7-8/10: set 1 Spo2 93% HR 81 RPE 7-8/10: set 2 Sp02 91% HR 72 RPE 9-10/10: Set 3        Gait training: 500 ft without ankle weights no breaks needed SpO2% 95% at the end of walking bout.BP 150/70. RPE 5/10. Verbal cueing for correct breathing and step technique.    Manael Therapy:  IASTM with thera-roller to bilat gastroc/soleus complex. Wells criteria = 0 prior to  manuel intervention. Pt reported reduction in sx to 1/10 post intervention. 13 mins.              PT Education - 11/17/20 1253     Education Details Pt educated on proper form and technique during therapuetic intervention and HEP.    Person(s) Educated Patient    Methods Explanation;Demonstration    Comprehension Verbalized understanding;Returned demonstration              PT Short Term Goals - 10/29/20 1422       PT SHORT TERM GOAL #1   Title Pt will undergo 6-minute walk test to assess functional endurance capacity.    Baseline 896 ft 6/29    Time 2    Period Weeks    Status Achieved    Target Date 11/10/20               PT Long Term Goals - 10/29/20 1423       PT LONG TERM GOAL #1   Title Pt will increase FOTO score to 59 to improve pt reported functional capacity for greater ADL tolerance    Baseline 6/38: 51    Time 6    Period Weeks    Status  New      PT LONG TERM GOAL #2   Title Pt will improve all LE MMT to 5/5 without pain to maximize independence and safety in ADL.    Baseline 6/27:4/5 bilat hip, 4/5 R knee ext (limted by 4/10 pain)    Period Weeks    Status New      PT LONG TERM GOAL #3   Title Pt will complete 5xSTS in <12 secs to improve BLE power for improved independence in AMB stairs.    Baseline 6/27: 17 sec (hands-free)    Time 6    Period Weeks    Status New      PT LONG TERM GOAL #4   Title Pt will be able to walk 1300 ft during a 6 minute walk test with a SPC for safe return functional amb in the community    Baseline 896    Time 6    Period Weeks    Status New    Target Date 12/08/20                   Plan - 11/17/20 1427     Clinical Impression Statement Pt reported reduction in bilat calf pain/soreness post NuStep to 3/10. With further reduction to 1/10 post IASTM with thera-roller to bilat gastroc/soleus complex. Pt was able to tolerate a greater endurance base tx today with oxygen saturation >91% during all activity. Pt was challenge (RPE9-10/10) with 3x6 stair climbing. Pt. will continue to benefit from skilled physical therapy to progress POC to address remaining deficits to facilitate maximum functional capacity for optimal personal health and wellness for ADLs.    Personal Factors and Comorbidities Age;Time since onset of injury/illness/exacerbation    Examination-Activity Limitations Stairs;Squat;Transfers;Toileting    Stability/Clinical Decision Making Evolving/Moderate complexity    Clinical Decision Making Moderate    Rehab Potential Good    PT Frequency 2x / week    PT Duration 6 weeks    PT Treatment/Interventions ADLs/Self Care Home Management;Cryotherapy;Moist Heat;Gait training;Stair training;Functional mobility training;Therapeutic activities;Therapeutic exercise;Balance training;Patient/family education;Neuromuscular re-education;Manual techniques;Passive range of  motion;Biofeedback;Electrical Stimulation;Traction;Dry needling;Energy conservation;Joint Manipulations;Spinal Manipulations    PT Next Visit Plan Reassess oxygen saturation with exercise: Increase Power target exercise.    PT Home Exercise Plan  [id number]    Consulted and Agree with Plan of Care Patient             Patient will benefit from skilled therapeutic intervention in order to improve the following deficits and impairments:  Postural dysfunction, Decreased mobility, Decreased activity tolerance, Decreased range of motion, Decreased strength, Hypomobility, Impaired flexibility, Decreased balance, Difficulty walking, Decreased endurance, Improper body mechanics, Decreased coordination, Decreased safety awareness  Visit Diagnosis: Muscle weakness (generalized)  Unsteadiness on feet  Gait difficulty  Difficulty in walking, not elsewhere classified     Problem List There are no problems to display for this patient.  Cammie Mcgee, PT, DPT # [id number] Lawernce Ion SPT 11/18/2020, 8:41 AM  [facility] [address] [place], [place], [address] Phone: [phone]   Fax:  [phone]  Name: Ross Odonnell MRN: [id number] Date of Birth: [birth year]

## 2020-11-19 ENCOUNTER — Other Ambulatory Visit: Payer: Self-pay

## 2020-11-19 ENCOUNTER — Encounter: Payer: Self-pay | Admitting: Physical Therapy

## 2020-11-19 ENCOUNTER — Ambulatory Visit: Payer: Medicare Other | Admitting: Physical Therapy

## 2020-11-19 DIAGNOSIS — R269 Unspecified abnormalities of gait and mobility: Secondary | ICD-10-CM

## 2020-11-19 DIAGNOSIS — M6281 Muscle weakness (generalized): Secondary | ICD-10-CM

## 2020-11-19 DIAGNOSIS — R2681 Unsteadiness on feet: Secondary | ICD-10-CM

## 2020-11-19 NOTE — Therapy (Signed)
[facility] [address] [place], [place], [address] Phone: [phone]   Fax:  [phone]  Physical Therapy Treatment  Patient Details  Name: Ross Odonnell MRN: [id number] Date of Birth: 1931-03-20 Referring Provider (PT): Valeda Malm, MD   Encounter Date: 11/19/2020   PT End of Session - 11/19/20 1345     Visit Number 7    Number of Visits 12    Date for PT Re-Evaluation 12/08/20    Authorization Type Medicare A&B primary; generic commercial secondary    Authorization Time Period 10/27/20-12/08/20    Authorization - Visit Number 7    Authorization - Number of Visits 10    Progress Note Due on Visit --    PT Start Time 1334    PT Stop Time 1421    PT Time Calculation (min) 47 min    Equipment Utilized During Treatment Gait belt    Activity Tolerance Patient tolerated treatment well;Patient limited by fatigue    Behavior During Therapy WFL for tasks assessed/performed             Past Medical History:  Diagnosis Date   Arthritis    Diabetes mellitus without complication (HCC)    Renal disorder     Past Surgical History:  Procedure Laterality Date   HERNIA REPAIR     LIVER CYST REMOVAL     ROTATOR CUFF REPAIR      There were no vitals filed for this visit.   Subjective Assessment - 11/19/20 1343     Subjective Pt presents to tx without complaints of knee pain or bilat calf soreness from last tx. Pt continues to state increase energy compared to previous tx.    Pertinent History Pt is currently independent in and out of home, however, is having more difficulty completing tasks. Pt enjoys wood working and being outside.    Limitations Standing;Walking;House hold activities;Lifting    How long can you sit comfortably? WNL    How long can you stand comfortably? WNL    How long can you walk comfortably? 5-10 mins    Diagnostic tests EMG scheduled 12/02/20    Patient Stated Goals Pt would like to gain a  better understanding of his current medical condition. Pt would like to start riding a tricycle outside for personal fitness.    Currently in Pain? No/denies    Pain Score 0-No pain    Pain Onset --                Ther.ex.:   Nustep 10 minutes at level 4. SpO2 94% HR 72 at minute 10 mark RPE 2/10   STS: with 6# pound ball over head during the top of squat. 1x10 resulted in 6-7/10 pain. (Seated hamstring stretch 3x30 secs each side.) 2ndx10 resulted in 3/10 pain. 3rdx10 (post contract-relax stretch) 5/10 pain.    4# ankle weights LAQ x40 each leg.   //bars with min UE assist with butt-kick walk with 4# ankle weights to facilitate dynamic hamstring power. X4 //bar laps.      .     Manual Therapy:   Hamstring stretch contract/relax 5 sec/20 sec 2x5 bilat. Supine at 90 degs of hip flexion. Pt educated on soreness after active stretching for the next 24 hrs. 1 mins stretch with PT OP each leg.           PT Education - 11/19/20 1344     Education Details Pt educated on proper form technique during  therapuetic intervention an HEP.    Person(s) Educated Patient    Methods Explanation;Demonstration    Comprehension Verbalized understanding;Returned demonstration              PT Short Term Goals - 10/29/20 1422       PT SHORT TERM GOAL #1   Title Pt will undergo 6-minute walk test to assess functional endurance capacity.    Baseline 896 ft 6/29    Time 2    Period Weeks    Status Achieved    Target Date 11/10/20               PT Long Term Goals - 10/29/20 1423       PT LONG TERM GOAL #1   Title Pt will increase FOTO score to 59 to improve pt reported functional capacity for greater ADL tolerance    Baseline 8/29: 51    Time 6    Period Weeks    Status New      PT LONG TERM GOAL #2   Title Pt will improve all LE MMT to 5/5 without pain to maximize independence and safety in ADL.    Baseline 6/27:4/5 bilat hip, 4/5 R knee ext (limted by 4/10  pain)    Period Weeks    Status New      PT LONG TERM GOAL #3   Title Pt will complete 5xSTS in <12 secs to improve BLE power for improved independence in AMB stairs.    Baseline 6/27: 17 sec (hands-free)    Time 6    Period Weeks    Status New      PT LONG TERM GOAL #4   Title Pt will be able to walk 1300 ft during a 6 minute walk test with a SPC for safe return functional amb in the community    Baseline 896    Time 6    Period Weeks    Status New    Target Date 12/08/20                   Plan - 11/19/20 1407     Clinical Impression Statement Pt displayed increase tolerance with tx 2/2 reduction in bilat calf pain. Pt stated 6-7/10 post knee pain after STS x10 with 6# ball over head press at the top of the squat. Pt reported decrease in pain 3/10 during the second STS set of 10 after seated hamstring stretching. Pt continues to display decreased LE strength, endurance, and power resulting in decrease ability to complete home and community ADLs to optimal level. Pt. will continue to benefit from skilled physical therapy to progress POC to address remaining deficits to facilitate maximum functional capacity for optimal personal health and wellness for ADLs.    Personal Factors and Comorbidities Age;Time since onset of injury/illness/exacerbation    Examination-Activity Limitations Stairs;Squat;Transfers;Toileting    Stability/Clinical Decision Making Evolving/Moderate complexity    Clinical Decision Making Moderate    Rehab Potential Good    PT Frequency 2x / week    PT Duration 6 weeks    PT Treatment/Interventions ADLs/Self Care Home Management;Cryotherapy;Moist Heat;Gait training;Stair training;Functional mobility training;Therapeutic activities;Therapeutic exercise;Balance training;Patient/family education;Neuromuscular re-education;Manual techniques;Passive range of motion;Biofeedback;Electrical Stimulation;Traction;Dry needling;Energy conservation;Joint  Manipulations;Spinal Manipulations    PT Next Visit Plan Reassess oxygen saturation with exercise: Increase Power target exercise.    PT Home Exercise Plan [id number]    Consulted and Agree with Plan of Care Patient  Patient will benefit from skilled therapeutic intervention in order to improve the following deficits and impairments:  Postural dysfunction, Decreased mobility, Decreased activity tolerance, Decreased range of motion, Decreased strength, Hypomobility, Impaired flexibility, Decreased balance, Difficulty walking, Decreased endurance, Improper body mechanics, Decreased coordination, Decreased safety awareness  Visit Diagnosis: Muscle weakness (generalized)  Unsteadiness on feet  Gait difficulty     Problem List There are no problems to display for this patient.  Cammie Mcgee, PT, DPT # [id number] Lawernce Ion, SPT 11/20/2020, 10:53 AM  [facility] [address] [place], [place], [address] Phone: [phone]   Fax:  [phone]  Name: Ross Odonnell MRN: [id number] Date of Birth: November 02, 1930

## 2020-11-24 ENCOUNTER — Encounter: Payer: Self-pay | Admitting: Physical Therapy

## 2020-11-24 ENCOUNTER — Other Ambulatory Visit: Payer: Self-pay

## 2020-11-24 ENCOUNTER — Ambulatory Visit: Payer: Medicare Other | Admitting: Physical Therapy

## 2020-11-24 DIAGNOSIS — R262 Difficulty in walking, not elsewhere classified: Secondary | ICD-10-CM

## 2020-11-24 DIAGNOSIS — R2681 Unsteadiness on feet: Secondary | ICD-10-CM

## 2020-11-24 DIAGNOSIS — R269 Unspecified abnormalities of gait and mobility: Secondary | ICD-10-CM

## 2020-11-24 DIAGNOSIS — M6281 Muscle weakness (generalized): Secondary | ICD-10-CM | POA: Diagnosis not present

## 2020-11-24 NOTE — Therapy (Signed)
[facility] [address] [place], [place], [address] Phone: [phone]   Fax:  [phone]  Physical Therapy Treatment  Patient Details  Name: Ross Odonnell MRN: [id number] Date of Birth: 07-07-1930 Referring Provider (PT): Valeda Malm, MD   Encounter Date: 11/24/2020   PT End of Session - 11/24/20 1738     Visit Number 8    Number of Visits 12    Date for PT Re-Evaluation 12/08/20    Authorization Type Medicare A&B primary; generic commercial secondary    Authorization Time Period 10/27/20-12/08/20    Authorization - Visit Number 8    Authorization - Number of Visits 10    PT Start Time 1248    PT Stop Time 1340    PT Time Calculation (min) 52 min    Equipment Utilized During Treatment Gait belt    Activity Tolerance Patient tolerated treatment well;Patient limited by fatigue    Behavior During Therapy WFL for tasks assessed/performed             Past Medical History:  Diagnosis Date   Arthritis    Diabetes mellitus without complication (HCC)    Renal disorder     Past Surgical History:  Procedure Laterality Date   HERNIA REPAIR     LIVER CYST REMOVAL     ROTATOR CUFF REPAIR      There were no vitals filed for this visit.   Subjective Assessment - 11/24/20 1252     Subjective Pt presents to tx with slight soreness in L hamstring. Pt continues to state increase energy compared to previous tx.    Pertinent History Pt is currently independent in and out of home, however, is having more difficulty completing tasks. Pt enjoys wood working and being outside.    Limitations Standing;Walking;House hold activities;Lifting    How long can you sit comfortably? WNL    How long can you stand comfortably? WNL    How long can you walk comfortably? 5-10 mins    Diagnostic tests EMG scheduled 12/02/20    Patient Stated Goals Pt would like to gain a better understanding of his current medical condition. Pt would like  to start riding a tricycle outside for personal fitness.    Currently in Pain? No/denies            Ther.ex.:   Nustep 10 minutes at level 4. SpO2 94% HR 69 /69 at minute 5/10 mark RPE 4/10                STS: with 6# pound ball over head during the top of squat. 1x10, no pain but soreness in B knee.     Standing: No UE support with butt-kick walk with 4# ankle weights to facilitate dynamic hamstring power. X10 each   Stair: 1 UE support required 1)side stepping up L-Rx10. VC for low descending  2) Stepping up L-R x8; B UE support required      Manual Therapy:   Hamstring stretch contract/relax 5 sec/20 sec 2x5 bilat. Supine at 90 degs of hip flexion. Pt educated on soreness after active stretching for the next 24 hrs. 1 mins stretch with PT OP each leg.           PT Short Term Goals - 10/29/20 1422       PT SHORT TERM GOAL #1   Title Pt will undergo 6-minute walk test to assess functional endurance capacity.    Baseline 896 ft 6/29  Time 2    Period Weeks    Status Achieved    Target Date 11/10/20               PT Long Term Goals - 10/29/20 1423       PT LONG TERM GOAL #1   Title Pt will increase FOTO score to 59 to improve pt reported functional capacity for greater ADL tolerance    Baseline 8/18: 51    Time 6    Period Weeks    Status New      PT LONG TERM GOAL #2   Title Pt will improve all LE MMT to 5/5 without pain to maximize independence and safety in ADL.    Baseline 6/27:4/5 bilat hip, 4/5 R knee ext (limted by 4/10 pain)    Period Weeks    Status New      PT LONG TERM GOAL #3   Title Pt will complete 5xSTS in <12 secs to improve BLE power for improved independence in AMB stairs.    Baseline 6/27: 17 sec (hands-free)    Time 6    Period Weeks    Status New      PT LONG TERM GOAL #4   Title Pt will be able to walk 1300 ft during a 6 minute walk test with a SPC for safe return functional amb in the community    Baseline 896     Time 6    Period Weeks    Status New    Target Date 12/08/20                   Plan - 11/24/20 1737     Clinical Impression Statement Session focused on progressive strengthening and stretching program. Pt reports soreness in bilateral hamstrings but no pain. SpO2 was above 94% and HR was between 65 to 75 bpm throughout session. Pt tolerated progressive stair strengthening exercises well, required UE support d/t fatigue. No pain reported post session.    Personal Factors and Comorbidities Age;Time since onset of injury/illness/exacerbation    Examination-Activity Limitations Stairs;Squat;Transfers;Toileting    Stability/Clinical Decision Making Evolving/Moderate complexity    Clinical Decision Making Moderate    Rehab Potential Good    PT Frequency 2x / week    PT Duration 6 weeks    PT Treatment/Interventions ADLs/Self Care Home Management;Cryotherapy;Moist Heat;Gait training;Stair training;Functional mobility training;Therapeutic activities;Therapeutic exercise;Balance training;Patient/family education;Neuromuscular re-education;Manual techniques;Passive range of motion;Biofeedback;Electrical Stimulation;Traction;Dry needling;Energy conservation;Joint Manipulations;Spinal Manipulations    PT Next Visit Plan Reassess oxygen saturation with exercise: Increase Power target exercise.    PT Home Exercise Plan [id number]    Consulted and Agree with Plan of Care Patient             Patient will benefit from skilled therapeutic intervention in order to improve the following deficits and impairments:  Postural dysfunction, Decreased mobility, Decreased activity tolerance, Decreased range of motion, Decreased strength, Hypomobility, Impaired flexibility, Decreased balance, Difficulty walking, Decreased endurance, Improper body mechanics, Decreased coordination, Decreased safety awareness  Visit Diagnosis: Muscle weakness (generalized)  Unsteadiness on feet  Gait  difficulty  Difficulty in walking, not elsewhere classified     Problem List There are no problems to display for this patient.  Cammie Mcgee, PT, DPT # [id number] Jenny Reichmann, SPT 11/25/2020, 8:39 AM  [facility] [address] [place], [place], [address] Phone: [phone]   Fax:  [phone]  Name: Ross Odonnell MRN: [id number] Date of Birth:  05/04/1930    

## 2020-11-26 ENCOUNTER — Other Ambulatory Visit: Payer: Self-pay

## 2020-11-26 ENCOUNTER — Encounter: Payer: Self-pay | Admitting: Physical Therapy

## 2020-11-26 ENCOUNTER — Ambulatory Visit: Payer: Medicare Other | Admitting: Physical Therapy

## 2020-11-26 DIAGNOSIS — R269 Unspecified abnormalities of gait and mobility: Secondary | ICD-10-CM

## 2020-11-26 DIAGNOSIS — M6281 Muscle weakness (generalized): Secondary | ICD-10-CM

## 2020-11-26 DIAGNOSIS — R262 Difficulty in walking, not elsewhere classified: Secondary | ICD-10-CM

## 2020-11-26 DIAGNOSIS — R2681 Unsteadiness on feet: Secondary | ICD-10-CM

## 2020-11-26 NOTE — Therapy (Signed)
[place] [facility] [address] [place], [place], [address] Phone: [phone]   Fax:  [phone]  Physical Therapy Treatment  Patient Details  Name: Ross Odonnell MRN: [id number] Date of Birth: 13-Feb-1931 Referring Provider (PT): Valeda Malm, MD   Encounter Date: 11/26/2020   PT End of Session - 11/26/20 1105     Visit Number 9    Number of Visits 12    Date for PT Re-Evaluation 12/08/20    Authorization Type Medicare A&B primary; generic commercial secondary    Authorization Time Period 10/27/20-12/08/20    Authorization - Visit Number 9    Authorization - Number of Visits 10    Progress Note Due on Visit 12    PT Start Time 1101    PT Stop Time 1152    PT Time Calculation (min) 51 min    Equipment Utilized During Treatment Gait belt    Activity Tolerance Patient tolerated treatment well;Patient limited by fatigue    Behavior During Therapy WFL for tasks assessed/performed             Past Medical History:  Diagnosis Date   Arthritis    Diabetes mellitus without complication (HCC)    Renal disorder     Past Surgical History:  Procedure Laterality Date   HERNIA REPAIR     LIVER CYST REMOVAL     ROTATOR CUFF REPAIR      There were no vitals filed for this visit.   Subjective Assessment - 11/26/20 1104     Subjective Pt present to tx with minor hip sorenss from last tx. Pt denies any other pain. Pt continues to report minor increase in energy in level each tx with home/community related tasks.    Pertinent History Pt is currently independent in and out of home, however, is having more difficulty completing tasks. Pt enjoys wood working and being outside.    Limitations Standing;Walking;House hold activities;Lifting    How long can you sit comfortably? WNL    How long can you stand comfortably? WNL    How long can you walk comfortably? 5-10 mins    Diagnostic tests EMG scheduled 12/02/20    Patient Stated  Goals Pt would like to gain a better understanding of his current medical condition. Pt would like to start riding a tricycle outside for personal fitness.    Currently in Pain? No/denies    Pain Score 0-No pain              Treatment  Ther.ex.:   Nustep 10 minutes at level 4. SpO2 94% HR 69 at minute 10 mark RPE 4/10. Pt did not need a break             Step up 12 inch step with Bilat UE assist x2 each side. 5/10 in eccentric loaded knee during step down. Exercise currently requires to much tissue load for pt current functional status.   Nautilus: Resisted walking x3 50# each direction forward and back. CGA-SBA with verbal cueing to ensure safe movement that is high-quality and controlled.        Stair: 3 x 6 ascent/decent  1 set: SpO2 90% HR 69 RPE 2/10  2UE  2 set: SpO2 91% HR 72 RPE 2/10  2UE 3 set: SpO2 91% HR 74 RPE 5/10  1UE 4 set:  SpO2 91% HR 72 RPE 5/10  1UE   Manual Therapy:   Bilat hip flex and flex with add to target post  hip tightness. 12 mins. Pt reported complete reduction in L post hip tightness.       PT Education - 11/26/20 1204     Education Details Pt educated on the role of knee OA causing reduced muscle firing even without pain.    Person(s) Educated Patient    Methods Explanation    Comprehension Verbalized understanding              PT Short Term Goals - 10/29/20 1422       PT SHORT TERM GOAL #1   Title Pt will undergo 6-minute walk test to assess functional endurance capacity.    Baseline 896 ft 6/29    Time 2    Period Weeks    Status Achieved    Target Date 11/10/20               PT Long Term Goals - 10/29/20 1423       PT LONG TERM GOAL #1   Title Pt will increase FOTO score to 59 to improve pt reported functional capacity for greater ADL tolerance    Baseline 0/26: 51    Time 6    Period Weeks    Status New      PT LONG TERM GOAL #2   Title Pt will improve all LE MMT to 5/5 without pain to maximize  independence and safety in ADL.    Baseline 6/27:4/5 bilat hip, 4/5 R knee ext (limted by 4/10 pain)    Period Weeks    Status New      PT LONG TERM GOAL #3   Title Pt will complete 5xSTS in <12 secs to improve BLE power for improved independence in AMB stairs.    Baseline 6/27: 17 sec (hands-free)    Time 6    Period Weeks    Status New      PT LONG TERM GOAL #4   Title Pt will be able to walk 1300 ft during a 6 minute walk test with a SPC for safe return functional amb in the community    Baseline 896    Time 6    Period Weeks    Status New    Target Date 12/08/20                   Plan - 11/26/20 1106     Clinical Impression Statement Pt displayed great tolerance during tx with SpO2 maintained above 90% during all activity with quick return to >95% after seated rest break. Pt was able to complete 10 mins of continuous NuStep activity at L4 without rest breaks. Pt was unable to complete 12 inch step up without pain and large amounts of UE assist. Pt reported complete reduction in L post hip tightness. Pt. will continue to benefit from skilled physical therapy to progress POC to address remaining deficits to facilitate maximum functional capacity for optimal personal health and wellness for ADLs.    Personal Factors and Comorbidities Age;Time since onset of injury/illness/exacerbation    Examination-Activity Limitations Stairs;Squat;Transfers;Toileting    Stability/Clinical Decision Making Evolving/Moderate complexity    Clinical Decision Making Moderate    Rehab Potential Good    PT Frequency 2x / week    PT Duration 6 weeks    PT Treatment/Interventions ADLs/Self Care Home Management;Cryotherapy;Moist Heat;Gait training;Stair training;Functional mobility training;Therapeutic activities;Therapeutic exercise;Balance training;Patient/family education;Neuromuscular re-education;Manual techniques;Passive range of motion;Biofeedback;Electrical Stimulation;Traction;Dry  needling;Energy conservation;Joint Manipulations;Spinal Manipulations    PT Next Visit Plan REASSESS NEXT TX.  PT Home Exercise Plan [id number]    Consulted and Agree with Plan of Care Patient             Patient will benefit from skilled therapeutic intervention in order to improve the following deficits and impairments:  Postural dysfunction, Decreased mobility, Decreased activity tolerance, Decreased range of motion, Decreased strength, Hypomobility, Impaired flexibility, Decreased balance, Difficulty walking, Decreased endurance, Improper body mechanics, Decreased coordination, Decreased safety awareness  Visit Diagnosis: Muscle weakness (generalized)  Unsteadiness on feet  Gait difficulty  Difficulty in walking, not elsewhere classified     Problem List There are no problems to display for this patient.  Cammie Mcgee, PT, DPT # [id number] Lawernce Ion SPT 11/27/2020, 8:42 AM   [facility] [address] [place], [place], [address] Phone: [phone]   Fax:  [phone]  Name: Ross Odonnell MRN: [id number] Date of Birth: 29-Jan-1931

## 2020-12-01 ENCOUNTER — Encounter: Payer: Self-pay | Admitting: Physical Therapy

## 2020-12-01 ENCOUNTER — Ambulatory Visit: Payer: Medicare Other | Attending: Physical Medicine and Rehabilitation | Admitting: Physical Therapy

## 2020-12-01 ENCOUNTER — Other Ambulatory Visit: Payer: Self-pay

## 2020-12-01 DIAGNOSIS — R262 Difficulty in walking, not elsewhere classified: Secondary | ICD-10-CM | POA: Insufficient documentation

## 2020-12-01 DIAGNOSIS — M6281 Muscle weakness (generalized): Secondary | ICD-10-CM | POA: Diagnosis present

## 2020-12-01 DIAGNOSIS — R2681 Unsteadiness on feet: Secondary | ICD-10-CM | POA: Insufficient documentation

## 2020-12-01 DIAGNOSIS — R269 Unspecified abnormalities of gait and mobility: Secondary | ICD-10-CM | POA: Insufficient documentation

## 2020-12-01 NOTE — Therapy (Signed)
[facility] [address]. [place], [place], [address] Phone: [phone]   Fax:  [phone]  Physical Therapy Treatment Physical Therapy Progress Note   Dates of reporting period  10/27/20   to  12/01/20  Patient Details  Name: Ross Odonnell MRN: [id number] Date of Birth: [birth year] Referring Provider (PT): Margorie John, MD   Encounter Date: 12/01/2020   PT End of Session - 12/01/20 1311     Visit Number 10    Number of Visits 12    Date for PT Re-Evaluation 12/08/20    Authorization Type Medicare A&B primary; generic commercial secondary    Authorization Time Period 10/27/20-12/08/20    Authorization - Visit Number 10    Authorization - Number of Visits 10    Progress Note Due on Visit --    PT Start Time 1254    PT Stop Time 5053    PT Time Calculation (min) 58 min    Equipment Utilized During Treatment Gait belt    Activity Tolerance Patient tolerated treatment well;Patient limited by fatigue    Behavior During Therapy WFL for tasks assessed/performed             Past Medical History:  Diagnosis Date   Arthritis    Diabetes mellitus without complication ([place])    Renal disorder     Past Surgical History:  Procedure Laterality Date   HERNIA REPAIR     LIVER CYST REMOVAL     ROTATOR CUFF REPAIR      There were no vitals filed for this visit.   Subjective Assessment - 12/01/20 1423     Subjective Pt presents to tx. with no new complaints.  Pt. scheduled for EMG study tomorrow but pt. is considering canceling appt.  Pt. discussed getting back on tricycle for a few weeks to maintain/ improve LE strength and endurance.    Pertinent History Pt is currently independent in and out of home, however, is having more difficulty completing tasks. Pt enjoys wood working and being outside.    Limitations Standing;Walking;House hold activities;Lifting    How long can you sit comfortably? WNL    How long can you stand  comfortably? WNL    How long can you walk comfortably? 5-10 mins    Diagnostic tests EMG scheduled 12/02/20    Patient Stated Goals Pt would like to gain a better understanding of his current medical condition. Pt would like to start riding a tricycle outside for personal fitness.    Currently in Pain? No/denies              Reassessment:    FOTO: 79  MMT: 4/5 bilat hip flex, 5/5 R knee ext (with no pain)   5xSTS: 13.3 second   6MWT: 1090ft (no seated rest breaks) Spo2: 95%    Treatment   Ther.ex.:   Nustep 10 minutes at level 4. SpO2 98% HR 76 at minute 10 mark RPE 4/10. Pt did not need a break              Nautilus: Resisted walking x5 50# each direction forward and back. CGA-SBA with verbal cueing to ensure safe movement that is high-quality and controlled.         Stair: 2 x 3 ascent/decent with bilat 5# ankle weight. Pt displayed minor UE assist with bilat handrails.            PT Education - 12/01/20 1536     Education Details Pt  educated on progression with goals from eval    Person(s) Educated Patient;Child(ren)    Methods Explanation    Comprehension Verbalized understanding              PT Short Term Goals - 10/29/20 1422       PT SHORT TERM GOAL #1   Title Pt will undergo 6-minute walk test to assess functional endurance capacity.    Baseline 896 ft 6/29    Time 2    Period Weeks    Status Achieved    Target Date 11/10/20               PT Long Term Goals - 12/01/20 1311       PT LONG TERM GOAL #1   Title Pt will increase FOTO score to 59 to improve pt reported functional capacity for greater ADL tolerance    Baseline 6/27: 51 8/1: 53    Time 6    Period Weeks    Status Partially Met    Target Date 12/08/20      PT LONG TERM GOAL #2   Title Pt will improve all LE MMT to 5/5 without pain to maximize independence and safety in ADL.    Baseline 6/27:4/5 bilat hip, 4/5 R knee ext (limted by 4/10 pain) 8/1: 4/5 bilat hip,  5/5 R knee ext. ( no pain)    Time 6    Period Weeks    Status Partially Met    Target Date 12/08/20      PT LONG TERM GOAL #3   Title Pt will complete 5xSTS in <12 secs to improve BLE power for improved independence in AMB stairs.    Baseline 6/27: 17 sec (hands-free) 8/1: 13.3 sec    Time 6    Period Weeks    Status New    Target Date 12/08/20      PT LONG TERM GOAL #4   Title Pt will be able to walk 1300 ft during a 6 minute walk test with a SPC for safe return functional amb in the community    Baseline 896  8/1: 1022ft (SPo2% 95. HR 90, RPE 4/10).    Time 6    Period Weeks    Status Partially Met    Target Date 12/08/20                   Plan - 12/01/20 1539     Clinical Impression Statement Pt displayed good progression with goals: FOTO: 53. MMT: 4/5 bilat hip flex, 5/5 R knee ext (with no pain). 5xSTS: 13.3 second. 6MWT: 10104ft (no seated rest breaks) Spo2: 95%. Pt has made progression with all goals, however, still displays LE power, endurance, and strength deficits that result in decreased functional capacity. POC was dicussed with pt and pt's daughter via phone. Pt gave verbal consent to call daughter and discussed PT POC. Pt and family will discuss the future tx direction and importance of EMG study. Pt would like to return to person fitness program with 3-wheeled bike, however the family is reserved due to safety concerns. Pt. will continue to benefit from skilled physical therapy to progress POC to address remaining deficits to facilitate maximum functional capacity for optimal personal health and wellness for ADLs.    Personal Factors and Comorbidities Age;Time since onset of injury/illness/exacerbation    Examination-Activity Limitations Stairs;Squat;Transfers;Toileting    Stability/Clinical Decision Making Evolving/Moderate complexity    Clinical Decision Making Moderate    Rehab Potential Good  PT Frequency 2x / week    PT Duration 6 weeks    PT  Treatment/Interventions ADLs/Self Care Home Management;Cryotherapy;Moist Heat;Gait training;Stair training;Functional mobility training;Therapeutic activities;Therapeutic exercise;Balance training;Patient/family education;Neuromuscular re-education;Manual techniques;Passive range of motion;Biofeedback;Electrical Stimulation;Traction;Dry needling;Energy conservation;Joint Manipulations;Spinal Manipulations    PT Next Visit Plan Update HEP next tx.    PT Home Exercise Plan [id number]    Consulted and Agree with Plan of Care Patient    Family Member Consulted Daughter via phone call. Pt gave verbal consent to approve the phone call.             Patient will benefit from skilled therapeutic intervention in order to improve the following deficits and impairments:  Postural dysfunction, Decreased mobility, Decreased activity tolerance, Decreased range of motion, Decreased strength, Hypomobility, Impaired flexibility, Decreased balance, Difficulty walking, Decreased endurance, Improper body mechanics, Decreased coordination, Decreased safety awareness  Visit Diagnosis: Muscle weakness (generalized)  Unsteadiness on feet  Gait difficulty  Difficulty in walking, not elsewhere classified     Problem List There are no problems to display for this patient.  Pura Spice, PT, DPT # [id number] Fara Olden SPT 12/02/2020, 8:07 AM  [facility] [address] [place], [place], [address] Phone: [phone]   Fax:  [phone]  Name: Ross Odonnell MRN: [id number] Date of Birth: 1930-09-07

## 2020-12-03 ENCOUNTER — Encounter: Payer: Self-pay | Admitting: Physical Therapy

## 2020-12-03 ENCOUNTER — Ambulatory Visit: Payer: Medicare Other | Admitting: Physical Therapy

## 2020-12-03 ENCOUNTER — Other Ambulatory Visit: Payer: Self-pay

## 2020-12-03 DIAGNOSIS — R2681 Unsteadiness on feet: Secondary | ICD-10-CM

## 2020-12-03 DIAGNOSIS — M6281 Muscle weakness (generalized): Secondary | ICD-10-CM | POA: Diagnosis not present

## 2020-12-03 DIAGNOSIS — R262 Difficulty in walking, not elsewhere classified: Secondary | ICD-10-CM

## 2020-12-03 DIAGNOSIS — R269 Unspecified abnormalities of gait and mobility: Secondary | ICD-10-CM

## 2020-12-03 NOTE — Therapy (Signed)
[place] [facility] [address] [place], [place], [address] Phone: [phone]   Fax:  [phone]  Physical Therapy Treatment  Patient Details  Name: Ross Odonnell MRN: [id number] Date of Birth: 1930-07-02 Referring Provider (PT): Margorie John, MD   Encounter Date: 12/03/2020   PT End of Session - 12/03/20 1259     Visit Number 11    Number of Visits 12    Date for PT Re-Evaluation 12/08/20    Authorization Type Medicare A&B primary; generic commercial secondary    Authorization Time Period 10/27/20-12/08/20    Authorization - Visit Number 1    Authorization - Number of Visits 10    PT Start Time 1248    PT Stop Time 1346    PT Time Calculation (min) 58 min    Equipment Utilized During Treatment Gait belt    Activity Tolerance Patient tolerated treatment well;Patient limited by fatigue    Behavior During Therapy WFL for tasks assessed/performed             Past Medical History:  Diagnosis Date   Arthritis    Diabetes mellitus without complication ([place])    Renal disorder     Past Surgical History:  Procedure Laterality Date   HERNIA REPAIR     LIVER CYST REMOVAL     ROTATOR CUFF REPAIR      There were no vitals filed for this visit.   Subjective Assessment - 12/03/20 1345     Subjective Pt presents to tx with 6/10 L knee pain. Pt states minor aggravation with the EMG study due to length of time and increase soreness. Pt stated the EMG found nothing and that his Neurologist wants to conduct neck xray. Pt denied futher testing. Pt stated that he almost cancelled tx due to increased soreness. Pt's son was present during tx.    Pertinent History Pt is currently independent in and out of home, however, is having more difficulty completing tasks. Pt enjoys wood working and being outside.    Limitations Standing;Walking;House hold activities;Lifting    How long can you sit comfortably? WNL    How long can you stand  comfortably? WNL    How long can you walk comfortably? 5-10 mins    Diagnostic tests EMG scheduled 12/02/20    Patient Stated Goals Pt would like to gain a better understanding of his current medical condition. Pt would like to start riding a tricycle outside for personal fitness.    Currently in Pain? Yes    Pain Score 6     Pain Location Knee    Pain Orientation Left    Pain Descriptors / Indicators Aching;Sore;Cramping    Pain Type Other (Comment)   Post EMG soreness             Treatment:   NuStep Level 4. No breaks needed. RPE 2-3/10 Spo2% 95 and HR 72 at the end of 10 mins     Amb. in clinic to analyze L antalgic gait pattern, decreased stride length, and decrease speed of gait.    Manual Therapy:   Ant +post L tibiofemoral grade2-3 5 bouts x 30 sec each direction.   STM to L peroneal brevis/longus in between bouts of joint mobs.   Prolonged stretching plantar flexion/inversion 30 sec bouts with resting position of dorsiflexion/eversion.         PT Education - 12/03/20 1348     Education Details Pt was educated on reasoning of soreness post  EMG, future POC due to EMG with WFL, and importance of continued activity to maintain current level of functional.    Person(s) Educated Patient;Child(ren)              PT Short Term Goals - 10/29/20 1422       PT SHORT TERM GOAL #1   Title Pt will undergo 6-minute walk test to assess functional endurance capacity.    Baseline 896 ft 6/29    Time 2    Period Weeks    Status Achieved    Target Date 11/10/20               PT Long Term Goals - 12/01/20 1311       PT LONG TERM GOAL #1   Title Pt will increase FOTO score to 59 to improve pt reported functional capacity for greater ADL tolerance    Baseline 6/27: 51 8/1: 53    Time 6    Period Weeks    Status Partially Met    Target Date 12/08/20      PT LONG TERM GOAL #2   Title Pt will improve all LE MMT to 5/5 without pain to maximize independence  and safety in ADL.    Baseline 6/27:4/5 bilat hip, 4/5 R knee ext (limted by 4/10 pain) 8/1: 4/5 bilat hip, 5/5 R knee ext. ( no pain)    Time 6    Period Weeks    Status Partially Met    Target Date 12/08/20      PT LONG TERM GOAL #3   Title Pt will complete 5xSTS in <12 secs to improve BLE power for improved independence in AMB stairs.    Baseline 6/27: 17 sec (hands-free) 8/1: 13.3 sec    Time 6    Period Weeks    Status New    Target Date 12/08/20      PT LONG TERM GOAL #4   Title Pt will be able to walk 1300 ft during a 6 minute walk test with a SPC for safe return functional amb in the community    Baseline 896  8/1: 1020ft (SPo2% 95. HR 90, RPE 4/10).    Time 6    Period Weeks    Status Partially Met    Target Date 12/08/20                   Plan - 12/03/20 1427     Clinical Impression Statement Treatment was focused on reduction of LLE pain and soreness post EMG testing. Pt presented with decrease gait tolerance 2/2 to increased L antalgic gait pattern. Pt reported complete reduction in pain to 0/10 post manual intervention. Pt also display return to pt's normalized gait pattern. Pt. will continue to benefit from skilled physical therapy to progress POC to address remaining deficits to facilitate maximum functional capacity for optimal personal health and wellness for ADLs.    Personal Factors and Comorbidities Age;Time since onset of injury/illness/exacerbation    Examination-Activity Limitations Stairs;Squat;Transfers;Toileting    Stability/Clinical Decision Making Evolving/Moderate complexity    Rehab Potential Good    PT Frequency 2x / week    PT Duration 6 weeks    PT Treatment/Interventions ADLs/Self Care Home Management;Cryotherapy;Moist Heat;Gait training;Stair training;Functional mobility training;Therapeutic activities;Therapeutic exercise;Balance training;Patient/family education;Neuromuscular re-education;Manual techniques;Passive range of  motion;Biofeedback;Electrical Stimulation;Traction;Dry needling;Energy conservation;Joint Manipulations;Spinal Manipulations    PT Next Visit Plan Update HEP next tx.    PT Home Exercise Plan [id number]    Consulted and Agree  with Plan of Care Patient    Family Member Consulted Son present for tx.             Patient will benefit from skilled therapeutic intervention in order to improve the following deficits and impairments:  Postural dysfunction, Decreased mobility, Decreased activity tolerance, Decreased range of motion, Decreased strength, Hypomobility, Impaired flexibility, Decreased balance, Difficulty walking, Decreased endurance, Improper body mechanics, Decreased coordination, Decreased safety awareness  Visit Diagnosis: Muscle weakness (generalized)  Unsteadiness on feet  Gait difficulty  Difficulty in walking, not elsewhere classified     Problem List There are no problems to display for this patient.  Pura Spice, PT, DPT # [id number] Fara Olden SPT 12/04/2020, 7:49 AM  [facility] [address] [place], [place], [address] Phone: [phone]   Fax:  [phone]  Name: Ross Odonnell MRN: [id number] Date of Birth: Sep 18, 1930

## 2020-12-08 ENCOUNTER — Other Ambulatory Visit: Payer: Self-pay

## 2020-12-08 ENCOUNTER — Encounter: Payer: Self-pay | Admitting: Physical Therapy

## 2020-12-08 ENCOUNTER — Ambulatory Visit: Payer: Medicare Other | Admitting: Physical Therapy

## 2020-12-08 DIAGNOSIS — M6281 Muscle weakness (generalized): Secondary | ICD-10-CM

## 2020-12-08 DIAGNOSIS — R2681 Unsteadiness on feet: Secondary | ICD-10-CM

## 2020-12-08 DIAGNOSIS — R262 Difficulty in walking, not elsewhere classified: Secondary | ICD-10-CM

## 2020-12-08 DIAGNOSIS — R269 Unspecified abnormalities of gait and mobility: Secondary | ICD-10-CM

## 2020-12-08 NOTE — Therapy (Signed)
[place] [facility] [address] [place], [place], [address] Phone: [phone]   Fax:  [phone]  Physical Therapy Treatment  Patient Details  Name: Ross Odonnell MRN: [id number] Date of Birth: 1930/12/31 Referring Provider (PT): Margorie John, MD   Encounter Date: 12/08/2020   PT End of Session - 12/08/20 1255     Visit Number 12    Number of Visits 12    Date for PT Re-Evaluation 12/08/20    Authorization Type Medicare A&B primary; generic commercial secondary    Authorization Time Period 10/27/20-12/08/20    Authorization - Visit Number 2    Authorization - Number of Visits 10    Progress Note Due on Visit 12    PT Start Time 1247    PT Stop Time 1338    PT Time Calculation (min) 51 min    Equipment Utilized During Treatment Gait belt    Activity Tolerance Patient tolerated treatment well;Patient limited by fatigue    Behavior During Therapy WFL for tasks assessed/performed             Past Medical History:  Diagnosis Date   Arthritis    Diabetes mellitus without complication ([place])    Renal disorder     Past Surgical History:  Procedure Laterality Date   HERNIA REPAIR     LIVER CYST REMOVAL     ROTATOR CUFF REPAIR      There were no vitals filed for this visit.   Subjective Assessment - 12/08/20 1250     Subjective Pt present to tx with 4/10 bilat calf soreness. Pt states continued reduction in sx after last tx with return to ADL without restriction.    Pertinent History Pt is currently independent in and out of home, however, is having more difficulty completing tasks. Pt enjoys wood working and being outside.    Limitations Standing;Walking;House hold activities;Lifting    How long can you sit comfortably? WNL    How long can you stand comfortably? WNL    How long can you walk comfortably? 5-10 mins    Diagnostic tests EMG scheduled 12/02/20    Patient Stated Goals Pt would like to gain a better  understanding of his current medical condition. Pt would like to start riding a tricycle outside for personal fitness.    Currently in Pain? Yes    Pain Score 4     Pain Location Calf    Pain Orientation Right;Left    Pain Descriptors / Indicators Sore               Treatment   Ther.ex.:   Nustep 10 minutes at level 4. SpO2 94% HR 69 at minute 10 mark RPE 4/10. Pt did not need a break             10 Min walking bout with 2 seated rest breaks. 2 # ankle weights bilat. ~1600 ft. SPC adjusted during amb to increase easy of walking with a more efficient gait. SpO2: >95% HR: 75-81 BPM, RPE: 3-5/10.    Stair: 3 x 6 ascent/decent with 2 # ankle weights bilat.   1 set: SpO2 93% HR 62 RPE 3/10  2UE  2 set: SpO2 92% HR 73 RPE 4/10  1UE 3 set: SpO2 93% HR 76 RPE 5/10  1UE   Manual Therapy:   Supine hook lying to allow proper access to bilat gastroc/soleus complex. 12 Min. Pt reported decrease soreness to 0/10 in static standing and during  gait.       PT Short Term Goals - 10/29/20 1422       PT SHORT TERM GOAL #1   Title Pt will undergo 6-minute walk test to assess functional endurance capacity.    Baseline 896 ft 6/29    Time 2    Period Weeks    Status Achieved    Target Date 11/10/20               PT Long Term Goals - 12/01/20 1311       PT LONG TERM GOAL #1   Title Pt will increase FOTO score to 59 to improve pt reported functional capacity for greater ADL tolerance    Baseline 6/27: 51 8/1: 53    Time 6    Period Weeks    Status Partially Met    Target Date 12/08/20      PT LONG TERM GOAL #2   Title Pt will improve all LE MMT to 5/5 without pain to maximize independence and safety in ADL.    Baseline 6/27:4/5 bilat hip, 4/5 R knee ext (limted by 4/10 pain) 8/1: 4/5 bilat hip, 5/5 R knee ext. ( no pain)    Time 6    Period Weeks    Status Partially Met    Target Date 12/08/20      PT LONG TERM GOAL #3   Title Pt will complete 5xSTS in <12 secs to  improve BLE power for improved independence in AMB stairs.    Baseline 6/27: 17 sec (hands-free) 8/1: 13.3 sec    Time 6    Period Weeks    Status New    Target Date 12/08/20      PT LONG TERM GOAL #4   Title Pt will be able to walk 1300 ft during a 6 minute walk test with a SPC for safe return functional amb in the community    Baseline 896  8/1: 1037ft (SPo2% 95. HR 90, RPE 4/10).    Time 6    Period Weeks    Status Partially Met    Target Date 12/08/20                   Plan - 12/08/20 1257     Clinical Impression Statement Today's tx was focused on increasing  endurance capacity with prolonged walking bouts and repeated stair climbing. Intensity of functional loading was increased with 2 # ankle weights bilat. Pt reported no increase in pain. Pt displayed SpO2%, HR, and RPE responded that is normal based on the exercise intensity. Pt. will continue to benefit from skilled physical therapy to progress POC to address remaining deficits to facilitate maximum functional capacity for optimal personal health and wellness for ADLs.    Personal Factors and Comorbidities Age;Time since onset of injury/illness/exacerbation    Examination-Activity Limitations Stairs;Squat;Transfers;Toileting    Stability/Clinical Decision Making Evolving/Moderate complexity    Clinical Decision Making Moderate    Rehab Potential Good    PT Frequency 2x / week    PT Duration 6 weeks    PT Treatment/Interventions ADLs/Self Care Home Management;Cryotherapy;Moist Heat;Gait training;Stair training;Functional mobility training;Therapeutic activities;Therapeutic exercise;Balance training;Patient/family education;Neuromuscular re-education;Manual techniques;Passive range of motion;Biofeedback;Electrical Stimulation;Traction;Dry needling;Energy conservation;Joint Manipulations;Spinal Manipulations    PT Next Visit Plan RECERT/possible D/C next visit and HEP update.    PT Home Exercise Plan [email or web address]     Consulted and Agree with Plan of Care Patient    Family Member Consulted Son present for tx.  Patient will benefit from skilled therapeutic intervention in order to improve the following deficits and impairments:  Postural dysfunction, Decreased mobility, Decreased activity tolerance, Decreased range of motion, Decreased strength, Hypomobility, Impaired flexibility, Decreased balance, Difficulty walking, Decreased endurance, Improper body mechanics, Decreased coordination, Decreased safety awareness  Visit Diagnosis: Muscle weakness (generalized)  Unsteadiness on feet  Gait difficulty  Difficulty in walking, not elsewhere classified     Problem List There are no problems to display for this patient.  Pura Spice, PT, DPT # [id number] Fara Olden SPT 12/08/2020, 6:38 PM  [facility] [address] [place], [place], [address] Phone: [phone]   Fax:  [phone]  Name: MORDCHE HEDGLIN MRN: [id number] Date of Birth: [birth year]

## 2020-12-10 ENCOUNTER — Ambulatory Visit: Payer: Medicare Other | Admitting: Physical Therapy

## 2020-12-10 ENCOUNTER — Other Ambulatory Visit: Payer: Self-pay

## 2020-12-10 DIAGNOSIS — R2681 Unsteadiness on feet: Secondary | ICD-10-CM

## 2020-12-10 DIAGNOSIS — R262 Difficulty in walking, not elsewhere classified: Secondary | ICD-10-CM

## 2020-12-10 DIAGNOSIS — M6281 Muscle weakness (generalized): Secondary | ICD-10-CM

## 2020-12-10 DIAGNOSIS — R269 Unspecified abnormalities of gait and mobility: Secondary | ICD-10-CM

## 2020-12-10 NOTE — Therapy (Signed)
[facility] [address] [place], [place], [address] Phone: [phone]   Fax:  [phone]  Physical Therapy Treatment and Discharge Note 10/27/20-12/10/20  Patient Details  Name: Ross Odonnell MRN: [id number] Date of Birth: December 04, 1930 Referring Provider (PT): Margorie John, MD   Encounter Date: 12/10/2020   PT End of Session - 12/10/20 1425     Visit Number 13    Number of Visits 13    Date for PT Re-Evaluation 12/10/20    Authorization Type Medicare A&B primary; generic commercial secondary    Authorization Time Period 10/27/20-12/08/20    Authorization - Visit Number 3    Authorization - Number of Visits 10    Progress Note Due on Visit 12    PT Start Time 1251    PT Stop Time 1336    PT Time Calculation (min) 45 min    Equipment Utilized During Treatment Gait belt    Activity Tolerance Patient tolerated treatment well;Patient limited by fatigue    Behavior During Therapy WFL for tasks assessed/performed             Past Medical History:  Diagnosis Date   Arthritis    Diabetes mellitus without complication ([place])    Renal disorder     Past Surgical History:  Procedure Laterality Date   HERNIA REPAIR     LIVER CYST REMOVAL     ROTATOR CUFF REPAIR      There were no vitals filed for this visit.   Subjective Assessment - 12/10/20 1326     Subjective Pt presents to tx with 2/10 bilat calf soreness after walking bouts in the yard yesterday. Pt is agreeable to d/c today and feels comforable with continued HEP exercise and scheduled walking bouts in the yard.  Pt stated 75% recovery at this point.    Pertinent History Pt is currently independent in and out of home, however, is having more difficulty completing tasks. Pt enjoys wood working and being outside.    Limitations Standing;Walking;House hold activities;Lifting    How long can you sit comfortably? WNL    How long can you stand comfortably? WNL    How  long can you walk comfortably? 5-10 mins    Diagnostic tests EMG scheduled 12/02/20    Patient Stated Goals Pt would like to gain a better understanding of his current medical condition. Pt would like to start riding a tricycle outside for personal fitness.    Currently in Pain? Yes    Pain Score 2     Pain Location Calf    Pain Orientation Right;Left    Pain Descriptors / Indicators Sore                [facility] PT Assessment - 12/11/20 0001       Assessment   Medical Diagnosis B LE weakness    Referring Provider (PT) Margorie John, MD    Onset Date/Surgical Date 05/03/20      Balance Screen   Has the patient fallen in the past 6 months No      [place] residence      Prior Function   Level of Independence Independent      Cognition   Overall Cognitive Status Within Functional Limits for tasks assessed             Reassessment for d/c:   FOTO: 8/10: 58   MMT: 8/10: 4+/5 bilat hip, 5/5   5xSTS:  8/10: 12.96  6MWT: 8/10: 1064 (SPo2% 95. HR 85, RPE3-4/10)   Therapeutic Exercise:  HEP update Access Code: [email or web address] URL: [email or web address] Date: 12/10/2020 Prepared by: Fara Olden, SPT Exercises  Sit to Stand with Arms Crossed - 1 x daily - 5 x weekly - 3 sets - 10 reps Standing Hip Abduction - 1 x daily - 5 x weekly - 3 sets - 10 reps Standing Hip Extension - 1 x daily - 5 x weekly - 3 sets - 10 reps Standing March with Counter Support - 1 x daily - 5 x weekly - 3 sets - 10 reps Standing Heel Raises - 1 x daily - 5 x weekly - 3 sets - 10 reps Seated Long Arc Quad - 1 x daily - 5 x weekly - 3 sets - 10 reps Standing Knee Flexion - 1 x daily - 5 x weekly - 3 sets - 10 reps      PT Education - 12/10/20 1425     Education Details Pt educated on updated HEP with ankle weights to continued therapuetic gains after d/c.    Person(s) Educated Patient    Methods Explanation;Demonstration;Tactile  cues;Verbal cues;Handout    Comprehension Verbalized understanding              PT Short Term Goals - 10/29/20 1422       PT SHORT TERM GOAL #1   Title Pt will undergo 6-minute walk test to assess functional endurance capacity.    Baseline 896 ft 6/29    Time 2    Period Weeks    Status Achieved    Target Date 11/10/20               PT Long Term Goals - 12/10/20 1303       PT LONG TERM GOAL #1   Title Pt will increase FOTO score to 59 to improve pt reported functional capacity for greater ADL tolerance    Baseline 6/27: 51 8/1: 53 8/10: 58    Time 6    Period Weeks    Status Partially Met    Target Date 12/10/20      PT LONG TERM GOAL #2   Title Pt will improve all LE MMT to 5/5 without pain to maximize independence and safety in ADL.    Baseline 6/27:4/5 bilat hip, 4/5 R knee ext (limted by 4/10 pain) 8/1: 4/5 bilat hip, 5/5 R knee ext. ( no pain) 8/10: 4+/5 bilat hip, 5/5    Time 6    Period Weeks    Status Partially Met    Target Date 12/10/20      PT LONG TERM GOAL #3   Title Pt will complete 5xSTS in <12 secs to improve BLE power for improved independence in AMB stairs.    Baseline 6/27: 17 sec (hands-free) 8/1: 13.3 sec 8/10: 12.96    Time 6    Period Weeks    Status Partially Met    Target Date 12/10/20      PT LONG TERM GOAL #4   Title Pt will be able to walk 1300 ft during a 6 minute walk test with a SPC for safe return functional amb in the community    Baseline 896  8/1: 1061f (SPo2% 95. HR 90, RPE 4/10). 8/10: 1064 (SPo2% 95. HR 85, RPE3-4/10)    Time 6    Period Weeks    Status Partially Met    Target Date 12/10/20  Plan - 12/10/20 1426     Clinical Impression Statement Pt was reassessed on this date for d/c. Pt displayed good improvement with all goals. Reassessment: FOTO: 58.  MMT:  4+/5 bilat hip, 5/5.  5xSTS: 12.96. 6MWT: 1064 (SPo2% 95. HR 85, RPE3-4/10). Pt states increase ease with stairs, walking  distance, and getting on and off the toilet. Pt was d/c with updated HEP and good prognosis to maintain therapeutic gains achieved during PT POC. Pt will return to PCP for possible prescription for medical tricycle for continued personal fitness program to optimal health maintenance of LE strength, endurance, and power. Pt was given clinic contact information to any addition of care and suggested to reach out for possible bike fitting if neccessary.    Personal Factors and Comorbidities Age;Time since onset of injury/illness/exacerbation    Examination-Activity Limitations Stairs;Squat;Transfers;Toileting    Stability/Clinical Decision Making Evolving/Moderate complexity    Clinical Decision Making Moderate    Rehab Potential Good    PT Frequency 2x / week    PT Duration 6 weeks    PT Treatment/Interventions ADLs/Self Care Home Management;Cryotherapy;Moist Heat;Gait training;Stair training;Functional mobility training;Therapeutic activities;Therapeutic exercise;Balance training;Patient/family education;Neuromuscular re-education;Manual techniques;Passive range of motion;Biofeedback;Electrical Stimulation;Traction;Dry needling;Energy conservation;Joint Manipulations;Spinal Manipulations    PT Next Visit Plan DISCHARGE from PT at this time.    PT Home Exercise Plan [email or web address]    Consulted and Agree with Plan of Care Patient             Patient will benefit from skilled therapeutic intervention in order to improve the following deficits and impairments:  Postural dysfunction, Decreased mobility, Decreased activity tolerance, Decreased range of motion, Decreased strength, Hypomobility, Impaired flexibility, Decreased balance, Difficulty walking, Decreased endurance, Improper body mechanics, Decreased coordination, Decreased safety awareness  Visit Diagnosis: Muscle weakness (generalized)  Unsteadiness on feet  Gait difficulty  Difficulty in walking, not elsewhere classified     Problem  List There are no problems to display for this patient.  Pura Spice, PT, DPT # [id number] Fara Olden SPT 12/11/2020, 9:30 AM  [place] [facility] [address] [place], [place], [address] Phone: [phone]   Fax:  [phone]  Name: NANDAN WILLEMS MRN: [id number] Date of Birth: 07-05-1930

## 2020-12-11 ENCOUNTER — Encounter: Payer: Self-pay | Admitting: Physical Therapy

## 2021-05-28 ENCOUNTER — Ambulatory Visit: Payer: Medicare Other | Admitting: Dermatology
